# Patient Record
Sex: Female | Born: 1965 | Race: Black or African American | Hispanic: No | Marital: Married | Smoking: Never smoker
Health system: Southern US, Community
[De-identification: ages and names within clinical notes are randomized; demographics above are authoritative.]

## PROBLEM LIST (undated history)

## (undated) DIAGNOSIS — F419 Anxiety disorder, unspecified: Secondary | ICD-10-CM

## (undated) HISTORY — PX: ABDOMINAL HYSTERECTOMY: SHX81

---

## 2019-05-02 ENCOUNTER — Emergency Department (HOSPITAL_COMMUNITY)

## 2019-05-02 ENCOUNTER — Emergency Department (HOSPITAL_COMMUNITY)
Admission: EM | Admit: 2019-05-02 | Discharge: 2019-05-02 | Disposition: A | Discharge status: Home / Self Care | Attending: Emergency Medicine | Admitting: Emergency Medicine

## 2019-05-02 ENCOUNTER — Other Ambulatory Visit: Payer: Self-pay

## 2019-05-02 ENCOUNTER — Encounter (HOSPITAL_COMMUNITY): Payer: Self-pay

## 2019-05-02 DIAGNOSIS — R200 Anesthesia of skin: Secondary | ICD-10-CM | POA: Diagnosis not present

## 2019-05-02 DIAGNOSIS — R0789 Other chest pain: Secondary | ICD-10-CM | POA: Diagnosis present

## 2019-05-02 DIAGNOSIS — Z5321 Procedure and treatment not carried out due to patient leaving prior to being seen by health care provider: Secondary | ICD-10-CM | POA: Diagnosis not present

## 2019-05-02 HISTORY — DX: Anxiety disorder, unspecified: F41.9

## 2019-05-02 LAB — CBC
HCT: 37.5 % (ref 36.0–46.0)
Hemoglobin: 11.4 g/dL — ABNORMAL LOW (ref 12.0–15.0)
MCH: 28 pg (ref 26.0–34.0)
MCHC: 30.4 g/dL (ref 30.0–36.0)
MCV: 92.1 fL (ref 80.0–100.0)
Platelets: 199 10*3/uL (ref 150–400)
RBC: 4.07 MIL/uL (ref 3.87–5.11)
RDW: 14.6 % (ref 11.5–15.5)
WBC: 6.1 10*3/uL (ref 4.0–10.5)
nRBC: 0 % (ref 0.0–0.2)

## 2019-05-02 LAB — BASIC METABOLIC PANEL
Anion gap: 13 (ref 5–15)
BUN: 17 mg/dL (ref 6–20)
CO2: 24 mmol/L (ref 22–32)
Calcium: 9.3 mg/dL (ref 8.9–10.3)
Chloride: 103 mmol/L (ref 98–111)
Creatinine, Ser: 1.07 mg/dL — ABNORMAL HIGH (ref 0.44–1.00)
GFR calc Af Amer: >60 mL/min (ref >60)
GFR calc non Af Amer: 58 mL/min — ABNORMAL LOW (ref >60)
Glucose, Bld: 88 mg/dL (ref 70–99)
Potassium: 3.9 mmol/L (ref 3.5–5.1)
Sodium: 140 mmol/L (ref 135–145)

## 2019-05-02 LAB — TROPONIN I (HIGH SENSITIVITY): Troponin I (High Sensitivity): 3 ng/L (ref <18)

## 2019-05-02 IMAGING — CR DG CHEST 2V
2 series · 2 of 2 positions shown · non-contrast
Comparison: None.

CLINICAL DATA: Chest pain

EXAM:
CHEST - 2 VIEW

[chest pa]
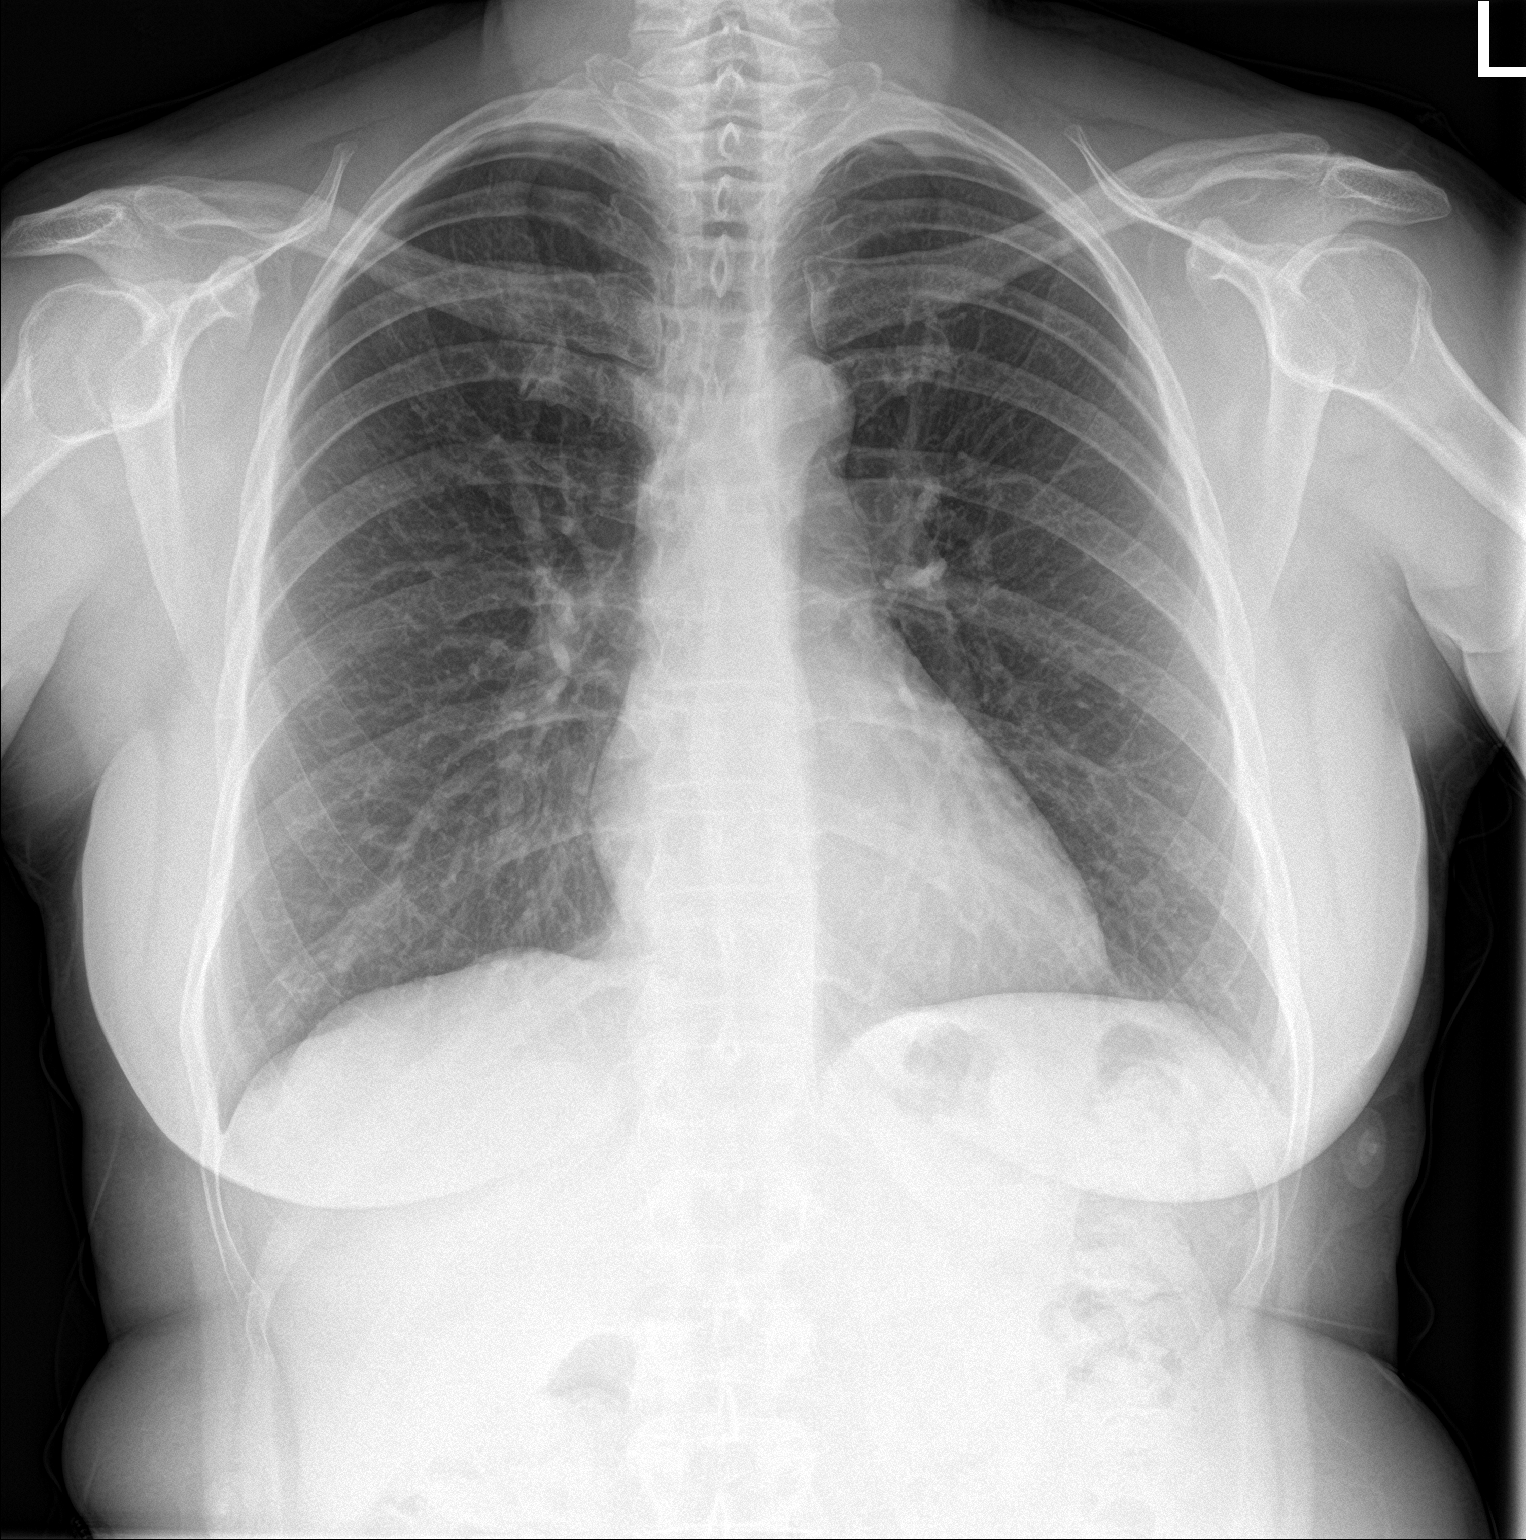

[chest lat]
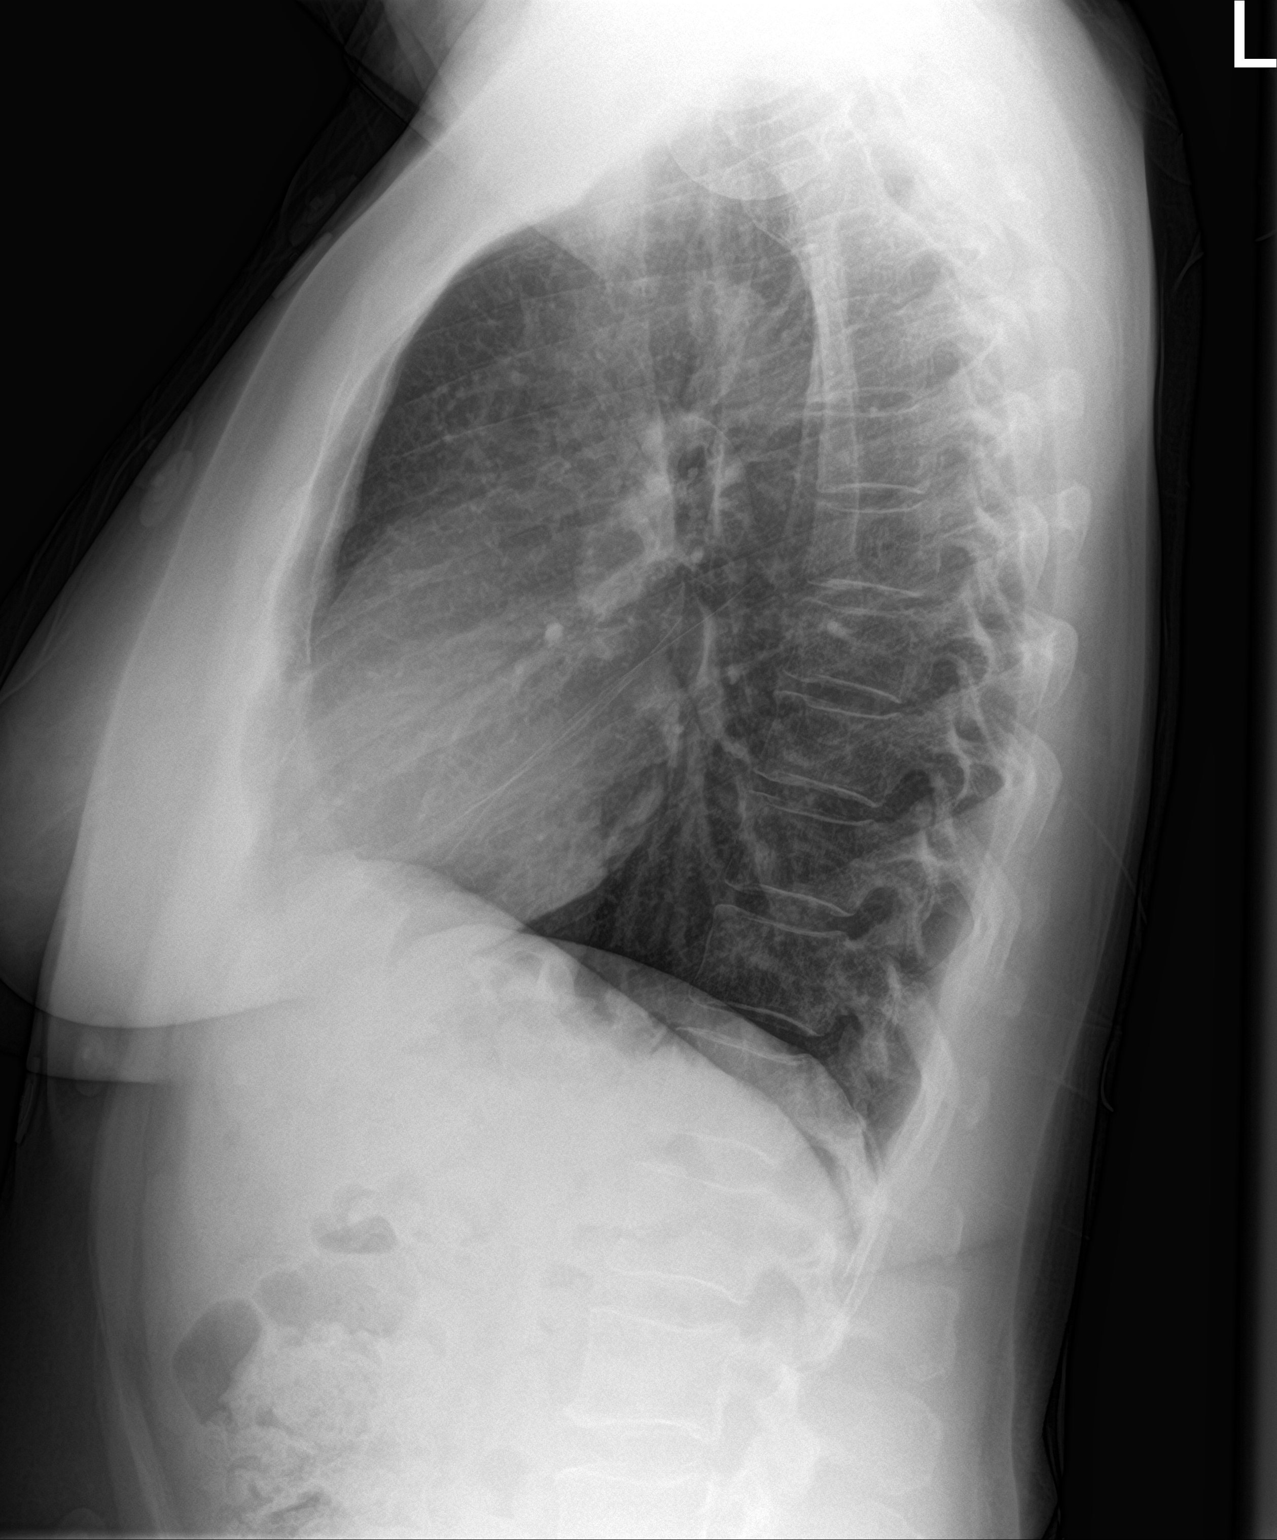

[2 of 2 positions shown; findings below may reference images not displayed]

FINDINGS: The heart size and mediastinal contours are within normal limits.
Both lungs are clear. The visualized skeletal structures are
unremarkable.
IMPRESSION: No acute abnormality of the lungs.

## 2019-05-02 MED ORDER — SODIUM CHLORIDE 0.9% FLUSH: 3.0000 mL | Freq: Once | INTRAVENOUS | Status: DC

## 2019-05-02 NOTE — ED Triage Notes (Addendum)
Pt presents with numbness in her Left hand radiating to her Left jaw associated w/Left side chest pain x2.5 weeks. Pt denies active chest pain today.  Recently moved here from [place] 3 months, still an employee of the city of [place] and spouse recently had a major surgery. Sent here from [facility] for further evaluation. She is unsure if this is stress, anxiety, nerve pain or cardiac.

## 2019-06-09 ENCOUNTER — Other Ambulatory Visit: Payer: Self-pay

## 2019-06-09 DIAGNOSIS — Z20822 Contact with and (suspected) exposure to covid-19: Secondary | ICD-10-CM

## 2019-06-11 LAB — NOVEL CORONAVIRUS, NAA: SARS-CoV-2, NAA: NOT DETECTED

## 2019-07-31 ENCOUNTER — Other Ambulatory Visit: Payer: Self-pay | Admitting: Internal Medicine

## 2019-07-31 DIAGNOSIS — R1032 Left lower quadrant pain: Secondary | ICD-10-CM

## 2019-08-01 ENCOUNTER — Other Ambulatory Visit: Payer: Self-pay | Admitting: Internal Medicine

## 2019-08-01 DIAGNOSIS — R1032 Left lower quadrant pain: Secondary | ICD-10-CM

## 2019-08-04 ENCOUNTER — Other Ambulatory Visit: Payer: Self-pay | Admitting: Internal Medicine

## 2019-08-04 DIAGNOSIS — R1032 Left lower quadrant pain: Secondary | ICD-10-CM

## 2019-08-08 ENCOUNTER — Other Ambulatory Visit: Payer: Self-pay

## 2019-08-14 ENCOUNTER — Ambulatory Visit
Admission: RE | Admit: 2019-08-14 | Discharge: 2019-08-14 | Disposition: A | Discharge status: Home / Self Care | Source: Ambulatory Visit | Attending: Internal Medicine | Admitting: Internal Medicine

## 2019-08-14 ENCOUNTER — Other Ambulatory Visit: Payer: Self-pay

## 2019-08-14 DIAGNOSIS — R1032 Left lower quadrant pain: Secondary | ICD-10-CM

## 2019-08-14 IMAGING — MR MR HIP*L* W/O CM
5 series · 40 of 40 positions shown · non-contrast
Comparison: None.

CLINICAL DATA: Chronic left hip pain.

EXAM:
MR OF THE LEFT HIP WITHOUT CONTRAST
TECHNIQUE: Multiplanar, multisequence MR imaging was performed. No intravenous
contrast was administered.

[Series 3: T1 · coronal · 4.0mm · 1.19mm/px · 8 of 24 slices shown]
[im 1/24]
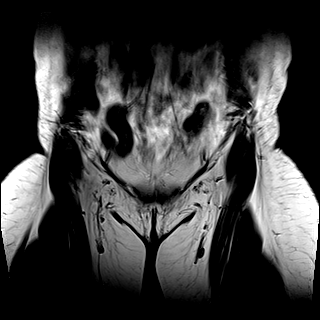
[im 4/24]
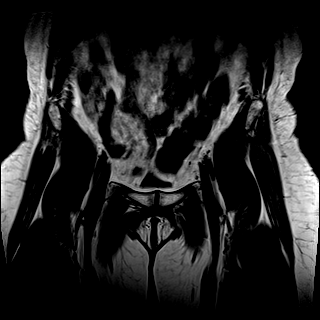
[im 7/24]
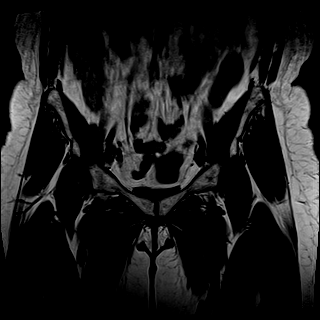
[im 10/24]
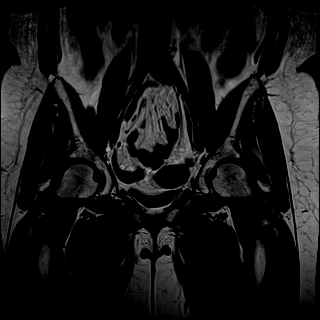
[im 14/24]
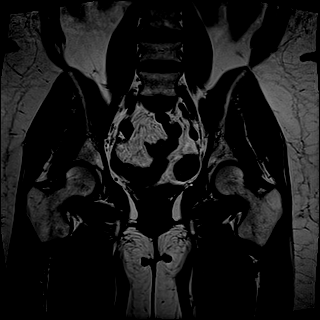
[im 17/24]
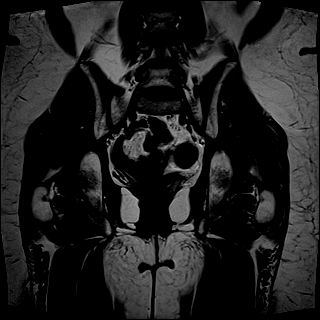
[im 20/24]
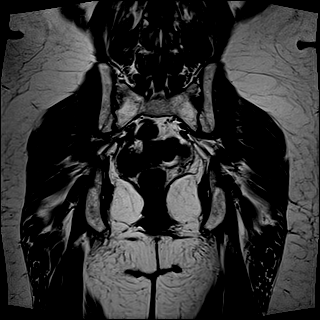
[im 24/24]
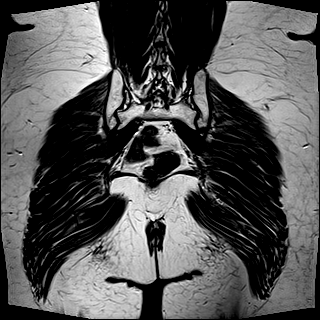

[Series 4: T2 fat-sat · coronal · 4.0mm · 1.19mm/px · 8 of 24 slices shown (1 of 2)]
[im 1/24]
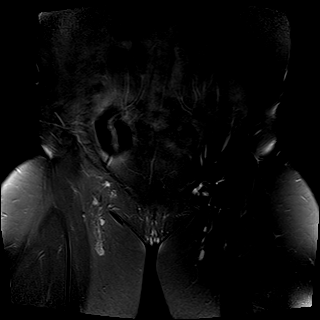
[im 4/24]
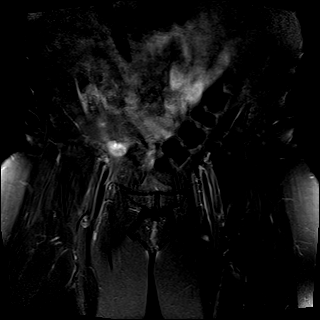
[im 7/24]
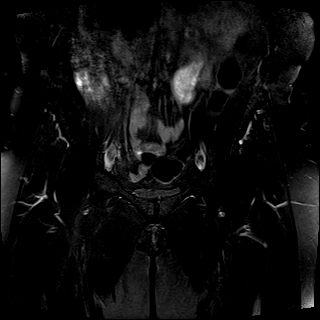
[im 10/24]
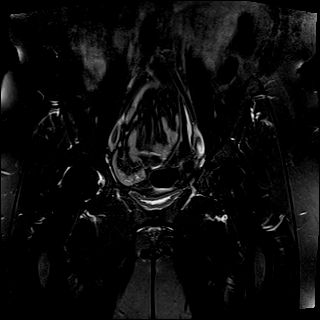
[im 14/24]
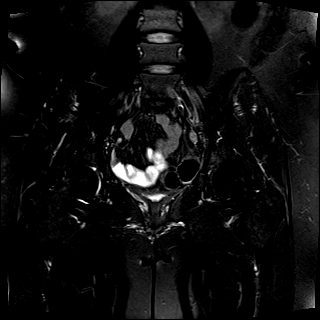
[im 17/24]
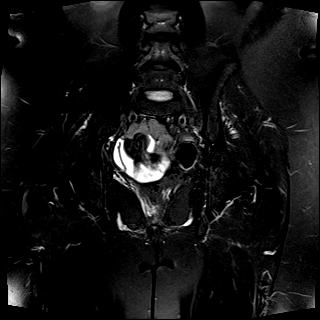
[im 20/24]
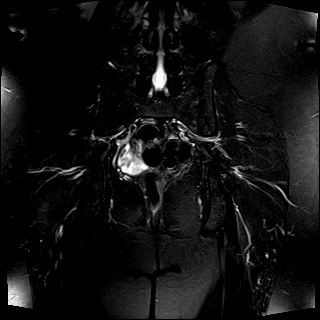
[im 24/24]
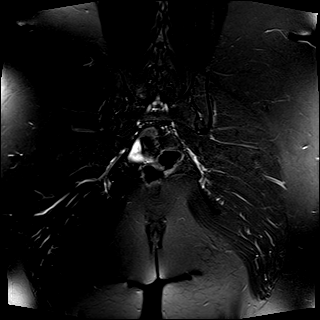

[Series 6: T2 fat-sat · axial · 4.0mm · 0.35mm/px · z∈[-93,+37]mm · 9 of 27 slices shown (2 of 2)]
[im 1/27]
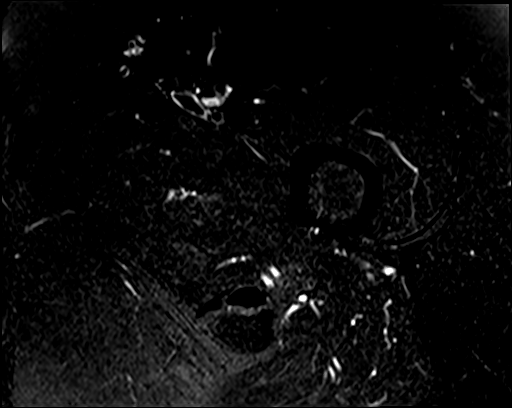
[im 4/27]
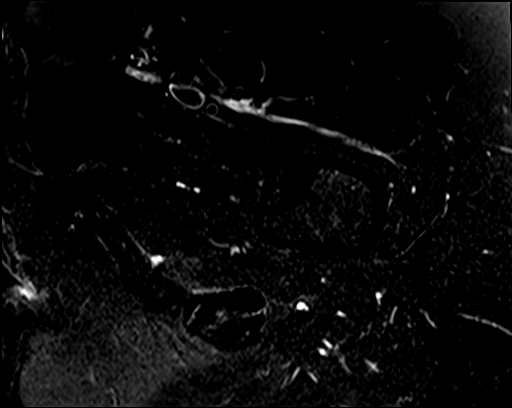
[im 7/27]
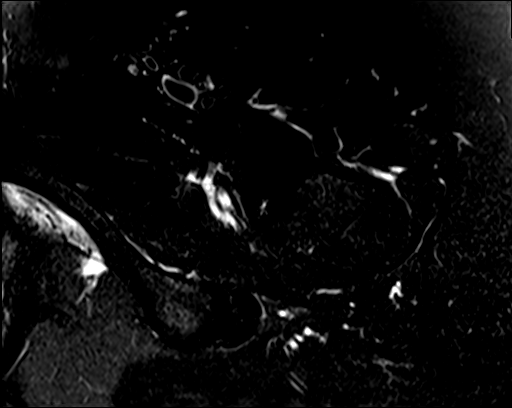
[im 10/27]
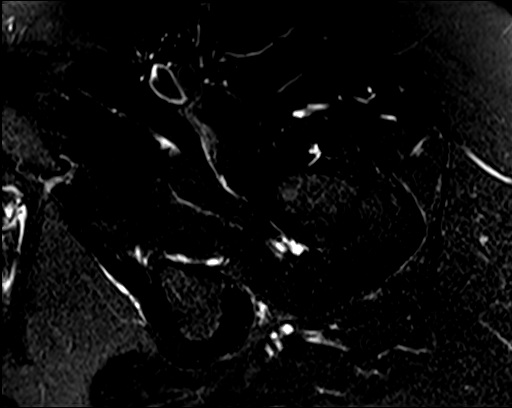
[im 14/27]
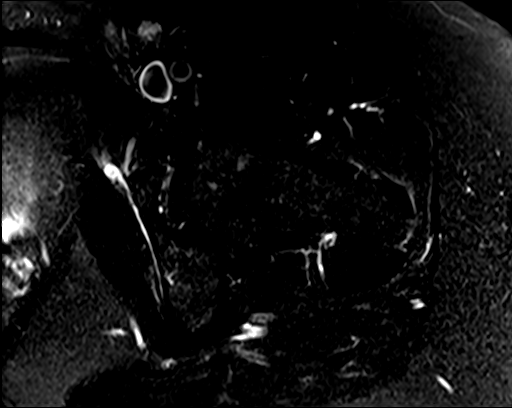
[im 17/27]
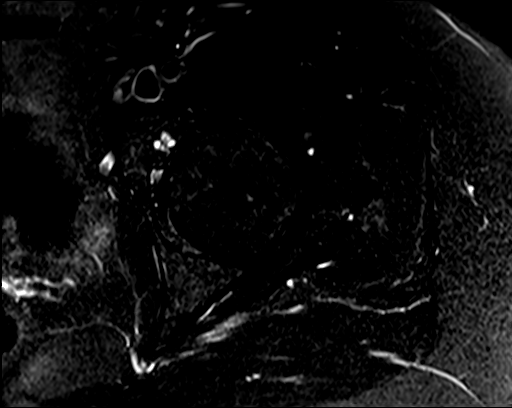
[im 20/27]
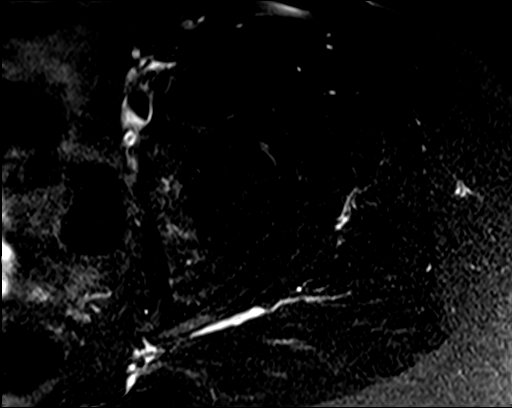
[im 23/27]
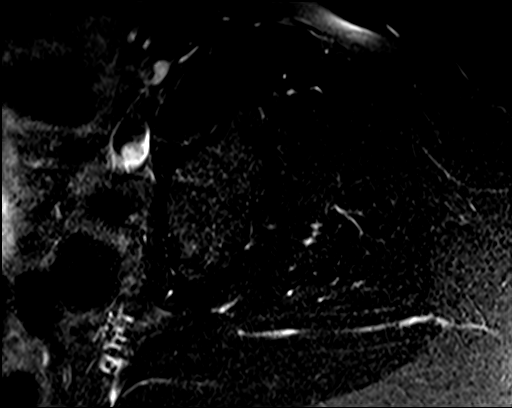
[im 27/27]
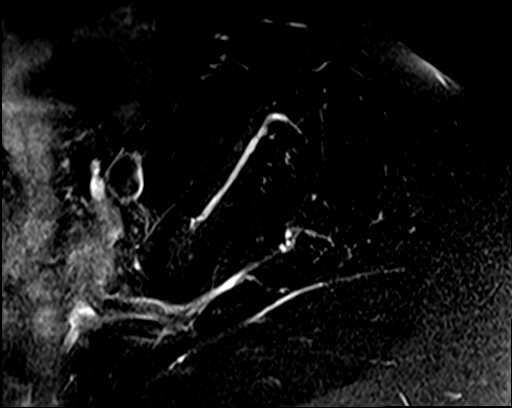

[Series 7: PD fat-sat · sagittal · 4.0mm · 0.70mm/px · 9 of 27 slices shown (1 of 2)]
[im 1/27]
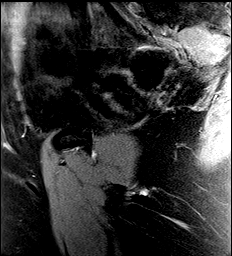
[im 4/27]
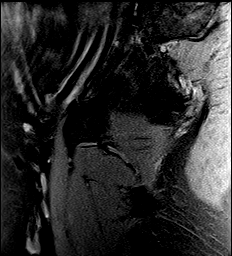
[im 7/27]
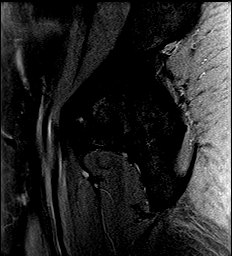
[im 10/27]
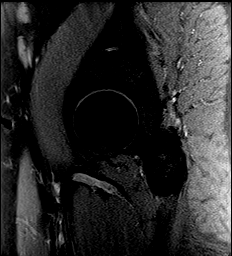
[im 14/27]
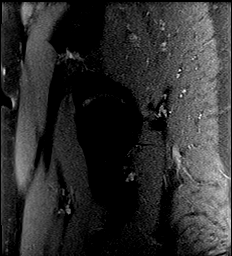
[im 17/27]
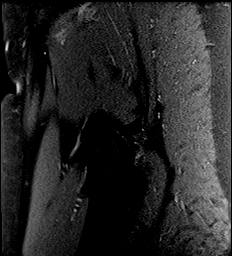
[im 20/27]
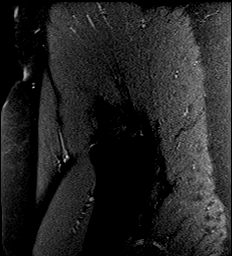
[im 23/27]
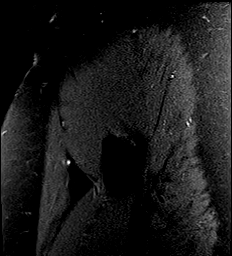
[im 27/27]
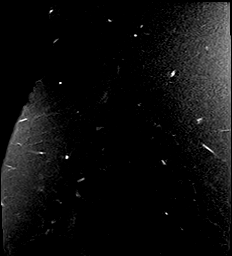

[Series 8: PD fat-sat · coronal · 4.0mm · 0.70mm/px · 6 of 19 slices shown (2 of 2)]
[im 1/19]
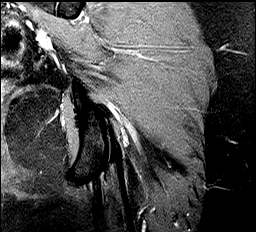
[im 4/19]
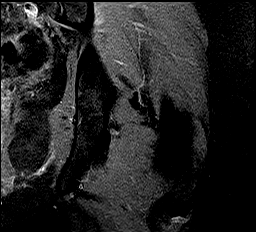
[im 8/19]
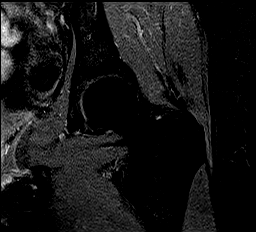
[im 11/19]
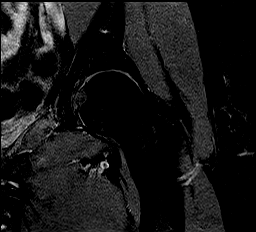
[im 15/19]
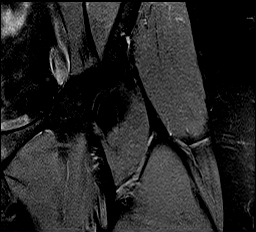
[im 19/19]
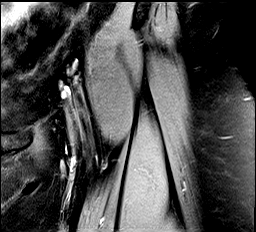

[40 of 40 positions shown; findings below may reference images not displayed]

FINDINGS: Both hips are normally located. Mild degenerative changes
bilaterally with mild joint space narrowing and early osteophytic
spurring. Small focus of subchondral cystic change involving the
acetabulum anteriorly. No full-thickness cartilage defects are
identified. No joint effusion. No stress fracture or AVN.

Mild bilateral peritendinitis but no findings for trochanteric
bursitis. The hip and pelvic musculature appear normal. No muscle
tear, mass or myositis. The hamstring tendons are intact. No
periarticular fluid collections to suggest a paralabral cyst.
Suspect labral degenerative changes without obvious tear.

The pubic symphysis and SI joints are intact. No pelvic fractures or
bone lesions.

No significant intrapelvic abnormalities are identified.
IMPRESSION: 1. Mild bilateral hip joint degenerative changes but no stress
fracture or AVN.
2. Mild bilateral peritendinitis but no findings for trochanteric
bursitis.
3. Normal appearing hip and pelvic musculature.

## 2019-08-21 ENCOUNTER — Other Ambulatory Visit: Payer: Self-pay

## 2019-08-24 ENCOUNTER — Ambulatory Visit: Attending: Family

## 2019-08-24 ENCOUNTER — Other Ambulatory Visit: Payer: Self-pay

## 2019-08-24 DIAGNOSIS — Z23 Encounter for immunization: Secondary | ICD-10-CM | POA: Insufficient documentation

## 2019-08-24 NOTE — Progress Notes (Signed)
   Covid-19 Vaccination Clinic  Name:  Zarriah Starkel    MRN: [id number] DOB: 1965-11-29  08/24/2019  Ms. Deberry Laural Benes was observed post Covid-19 immunization for 15 minutes without incidence. She was provided with Vaccine Information Sheet and instruction to access the V-Safe system.   Ms. Hollan Philipp was instructed to call 911 with any severe reactions post vaccine: Marland Kitchen Difficulty breathing  . Swelling of your face and throat  . A fast heartbeat  . A bad rash all over your body  . Dizziness and weakness    Immunizations Administered    Name Date Dose VIS Date Route   Moderna COVID-19 Vaccine 08/24/2019  3:54 PM 0.5 mL 05/30/2019 Intramuscular   Manufacturer: Moderna   Lot: [id number]   NDC: 14782-956-21

## 2019-09-26 ENCOUNTER — Ambulatory Visit: Attending: Family

## 2019-09-26 DIAGNOSIS — Z23 Encounter for immunization: Secondary | ICD-10-CM

## 2019-09-26 NOTE — Progress Notes (Signed)
   Covid-19 Vaccination Clinic  Name:  Mary Massey    MRN: [id number] DOB: 11-07-1965  09/26/2019  Mary Massey was observed post Covid-19 immunization for 15 minutes without incident. She was provided with Vaccine Information Sheet and instruction to access the V-Safe system.   Mary Massey was instructed to call 911 with any severe reactions post vaccine: Marland Kitchen Difficulty breathing  . Swelling of face and throat  . A fast heartbeat  . A bad rash all over body  . Dizziness and weakness   Immunizations Administered    Name Date Dose VIS Date Route   Moderna COVID-19 Vaccine 09/26/2019 12:13 PM 0.5 mL 05/30/2019 Intramuscular   Manufacturer: Moderna   Lot: [id number]   NDC: 42103-128-11

## 2021-02-22 ENCOUNTER — Emergency Department (HOSPITAL_COMMUNITY)

## 2021-02-22 ENCOUNTER — Inpatient Hospital Stay (HOSPITAL_COMMUNITY)
Admission: EM | Admit: 2021-02-22 | Discharge: 2021-02-23 | DRG: 923 | Disposition: A | Discharge status: Home / Self Care | Attending: Internal Medicine | Admitting: Internal Medicine

## 2021-02-22 ENCOUNTER — Encounter (HOSPITAL_COMMUNITY): Payer: Self-pay

## 2021-02-22 DIAGNOSIS — R7303 Prediabetes: Secondary | ICD-10-CM | POA: Diagnosis present

## 2021-02-22 DIAGNOSIS — X30XXXA Exposure to excessive natural heat, initial encounter: Secondary | ICD-10-CM

## 2021-02-22 DIAGNOSIS — G8191 Hemiplegia, unspecified affecting right dominant side: Secondary | ICD-10-CM | POA: Diagnosis not present

## 2021-02-22 DIAGNOSIS — R29818 Other symptoms and signs involving the nervous system: Secondary | ICD-10-CM

## 2021-02-22 DIAGNOSIS — Z833 Family history of diabetes mellitus: Secondary | ICD-10-CM

## 2021-02-22 DIAGNOSIS — R21 Rash and other nonspecific skin eruption: Secondary | ICD-10-CM | POA: Diagnosis present

## 2021-02-22 DIAGNOSIS — Z6830 Body mass index (BMI) 30.0-30.9, adult: Secondary | ICD-10-CM | POA: Diagnosis not present

## 2021-02-22 DIAGNOSIS — Z90710 Acquired absence of both cervix and uterus: Secondary | ICD-10-CM | POA: Diagnosis not present

## 2021-02-22 DIAGNOSIS — R2990 Unspecified symptoms and signs involving the nervous system: Secondary | ICD-10-CM | POA: Diagnosis not present

## 2021-02-22 DIAGNOSIS — R29707 NIHSS score 7: Secondary | ICD-10-CM | POA: Diagnosis present

## 2021-02-22 DIAGNOSIS — F32A Depression, unspecified: Secondary | ICD-10-CM | POA: Diagnosis not present

## 2021-02-22 DIAGNOSIS — E785 Hyperlipidemia, unspecified: Secondary | ICD-10-CM | POA: Diagnosis not present

## 2021-02-22 DIAGNOSIS — E860 Dehydration: Secondary | ICD-10-CM | POA: Diagnosis present

## 2021-02-22 DIAGNOSIS — T675XXA Heat exhaustion, unspecified, initial encounter: Secondary | ICD-10-CM | POA: Diagnosis present

## 2021-02-22 DIAGNOSIS — E669 Obesity, unspecified: Secondary | ICD-10-CM | POA: Diagnosis not present

## 2021-02-22 DIAGNOSIS — F419 Anxiety disorder, unspecified: Secondary | ICD-10-CM | POA: Diagnosis present

## 2021-02-22 DIAGNOSIS — H538 Other visual disturbances: Secondary | ICD-10-CM | POA: Diagnosis not present

## 2021-02-22 DIAGNOSIS — R001 Bradycardia, unspecified: Secondary | ICD-10-CM | POA: Diagnosis present

## 2021-02-22 DIAGNOSIS — G459 Transient cerebral ischemic attack, unspecified: Secondary | ICD-10-CM | POA: Diagnosis present

## 2021-02-22 DIAGNOSIS — Z66 Do not resuscitate: Secondary | ICD-10-CM | POA: Diagnosis present

## 2021-02-22 DIAGNOSIS — R531 Weakness: Secondary | ICD-10-CM

## 2021-02-22 DIAGNOSIS — R29810 Facial weakness: Secondary | ICD-10-CM | POA: Diagnosis present

## 2021-02-22 LAB — DIFFERENTIAL
Abs Immature Granulocytes: 0 10*3/uL (ref 0.00–0.07)
Basophils Absolute: 0 10*3/uL (ref 0.0–0.1)
Basophils Relative: 0 %
Eosinophils Absolute: 0.3 10*3/uL (ref 0.0–0.5)
Eosinophils Relative: 5 %
Lymphocytes Relative: 33 %
Lymphs Abs: 1.9 10*3/uL (ref 0.7–4.0)
Monocytes Absolute: 0.3 10*3/uL (ref 0.1–1.0)
Monocytes Relative: 5 %
Neutro Abs: 3.2 10*3/uL (ref 1.7–7.7)
Neutrophils Relative %: 57 %
nRBC: 0 /100 WBC

## 2021-02-22 LAB — I-STAT CHEM 8, ED
BUN: 21 mg/dL — ABNORMAL HIGH (ref 6–20)
Calcium, Ion: 1.11 mmol/L — ABNORMAL LOW (ref 1.15–1.40)
Chloride: 107 mmol/L (ref 98–111)
Creatinine, Ser: 1.1 mg/dL — ABNORMAL HIGH (ref 0.44–1.00)
Glucose, Bld: 95 mg/dL (ref 70–99)
HCT: 33 % — ABNORMAL LOW (ref 36.0–46.0)
Hemoglobin: 11.2 g/dL — ABNORMAL LOW (ref 12.0–15.0)
Potassium: 3.9 mmol/L (ref 3.5–5.1)
Sodium: 140 mmol/L (ref 135–145)
TCO2: 22 mmol/L (ref 22–32)

## 2021-02-22 LAB — COMPREHENSIVE METABOLIC PANEL
ALT: 15 U/L (ref 0–44)
AST: 30 U/L (ref 15–41)
Albumin: 3.8 g/dL (ref 3.5–5.0)
Alkaline Phosphatase: 65 U/L (ref 38–126)
Anion gap: 11 (ref 5–15)
BUN: 17 mg/dL (ref 6–20)
CO2: 22 mmol/L (ref 22–32)
Calcium: 9.5 mg/dL (ref 8.9–10.3)
Chloride: 106 mmol/L (ref 98–111)
Creatinine, Ser: 1.22 mg/dL — ABNORMAL HIGH (ref 0.44–1.00)
GFR, Estimated: 52 mL/min — ABNORMAL LOW (ref >60)
Glucose, Bld: 100 mg/dL — ABNORMAL HIGH (ref 70–99)
Potassium: 4.1 mmol/L (ref 3.5–5.1)
Sodium: 139 mmol/L (ref 135–145)
Total Bilirubin: 1.1 mg/dL (ref 0.3–1.2)
Total Protein: 7 g/dL (ref 6.5–8.1)

## 2021-02-22 LAB — CBG MONITORING, ED: Glucose-Capillary: 101 mg/dL — ABNORMAL HIGH (ref 70–99)

## 2021-02-22 LAB — HEMOGLOBIN A1C
Hgb A1c MFr Bld: 6.4 % — ABNORMAL HIGH (ref 4.8–5.6)
Mean Plasma Glucose: 136.98 mg/dL

## 2021-02-22 LAB — TSH: TSH: 1.084 u[IU]/mL (ref 0.350–4.500)

## 2021-02-22 LAB — PROTIME-INR
INR: 1 (ref 0.8–1.2)
Prothrombin Time: 12.9 seconds (ref 11.4–15.2)

## 2021-02-22 LAB — CBC
HCT: 33.7 % — ABNORMAL LOW (ref 36.0–46.0)
HCT: 33.4 % — ABNORMAL LOW (ref 36.0–46.0)
Hemoglobin: 11.1 g/dL — ABNORMAL LOW (ref 12.0–15.0)
Hemoglobin: 10.9 g/dL — ABNORMAL LOW (ref 12.0–15.0)
MCH: 28.8 pg (ref 26.0–34.0)
MCH: 28.8 pg (ref 26.0–34.0)
MCHC: 32.9 g/dL (ref 30.0–36.0)
MCHC: 32.6 g/dL (ref 30.0–36.0)
MCV: 87.5 fL (ref 80.0–100.0)
MCV: 88.1 fL (ref 80.0–100.0)
Platelets: 157 10*3/uL (ref 150–400)
Platelets: 146 10*3/uL — ABNORMAL LOW (ref 150–400)
RBC: 3.85 MIL/uL — ABNORMAL LOW (ref 3.87–5.11)
RBC: 3.79 MIL/uL — ABNORMAL LOW (ref 3.87–5.11)
RDW: 14.1 % (ref 11.5–15.5)
RDW: 14.2 % (ref 11.5–15.5)
WBC: 5.7 10*3/uL (ref 4.0–10.5)
WBC: 5.8 10*3/uL (ref 4.0–10.5)
nRBC: 0 % (ref 0.0–0.2)
nRBC: 0 % (ref 0.0–0.2)

## 2021-02-22 LAB — LIPID PANEL
Cholesterol: 229 mg/dL — ABNORMAL HIGH (ref 0–200)
HDL: 74 mg/dL (ref >40)
LDL Cholesterol: 148 mg/dL — ABNORMAL HIGH (ref 0–99)
Total CHOL/HDL Ratio: 3.1 RATIO
Triglycerides: 36 mg/dL (ref <150)
VLDL: 7 mg/dL (ref 0–40)

## 2021-02-22 LAB — I-STAT BETA HCG BLOOD, ED (MC, WL, AP ONLY): I-stat hCG, quantitative: <5 m[IU]/mL (ref <5)

## 2021-02-22 LAB — CREATININE, SERUM
Creatinine, Ser: 1.02 mg/dL — ABNORMAL HIGH (ref 0.44–1.00)
GFR, Estimated: >60 mL/min (ref >60)

## 2021-02-22 LAB — HIV ANTIBODY (ROUTINE TESTING W REFLEX): HIV Screen 4th Generation wRfx: NONREACTIVE

## 2021-02-22 LAB — APTT: aPTT: 31 seconds (ref 24–36)

## 2021-02-22 LAB — VITAMIN B12: Vitamin B-12: 2843 pg/mL — ABNORMAL HIGH (ref 180–914)

## 2021-02-22 IMAGING — CT CT HEAD CODE STROKE
4 series · 17 of 47 positions shown, 19 images · non-contrast
Comparison: None.

CLINICAL DATA: Code stroke.  55-year-old female

EXAM:
CT HEAD WITHOUT CONTRAST
TECHNIQUE: Contiguous axial images were obtained from the base of the skull
through the vertex without intravenous contrast.

[Series 2: head wo · axial · 0.40mm/px · z∈[-84,+36]mm · 7 of 33 slices shown, 9 images]
[im 5/33  brain]
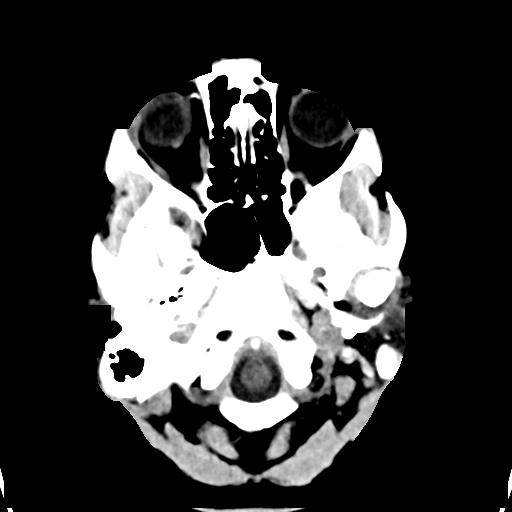
[im 5/33  bone]
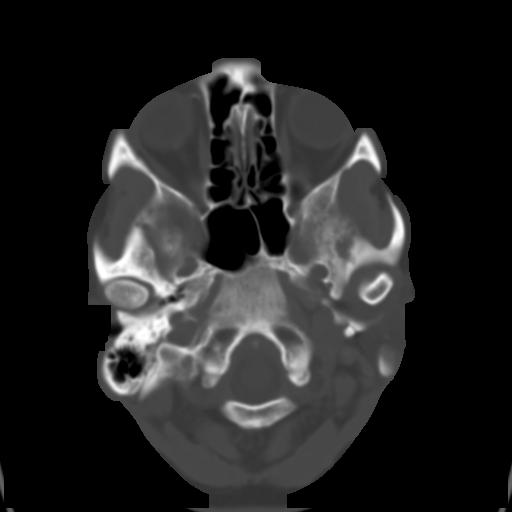
[im 9/33  brain]
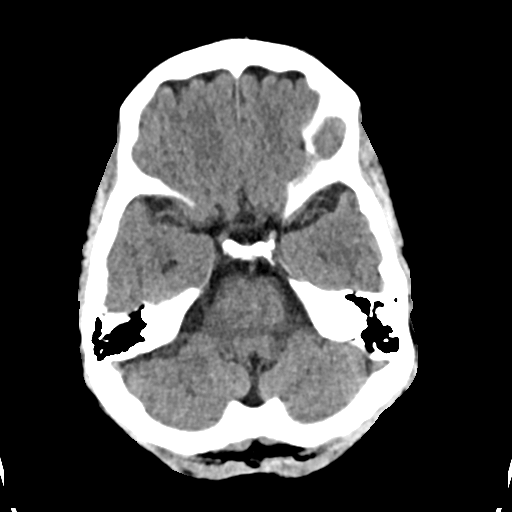
[im 13/33  brain]
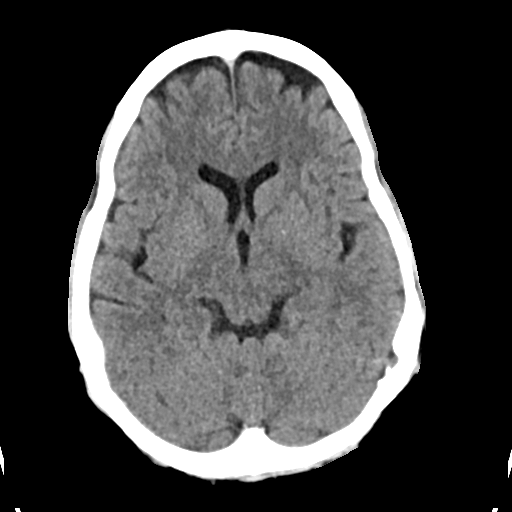
[im 17/33  brain]
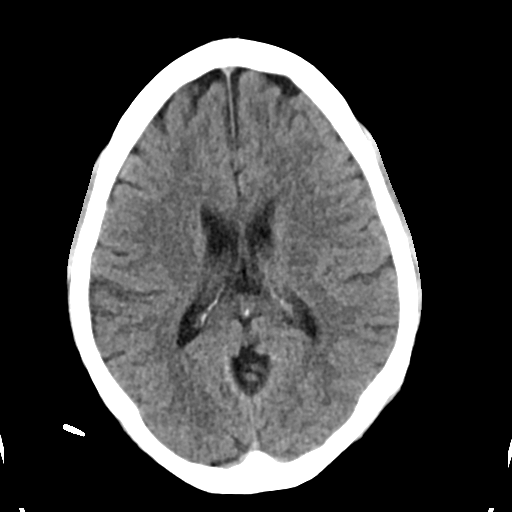
[im 21/33  brain]
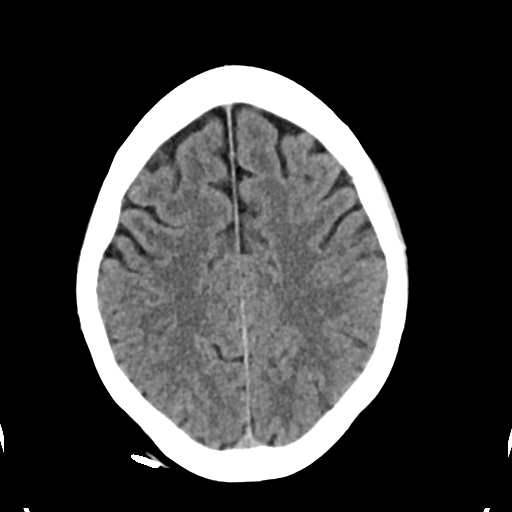
[im 21/33  bone]
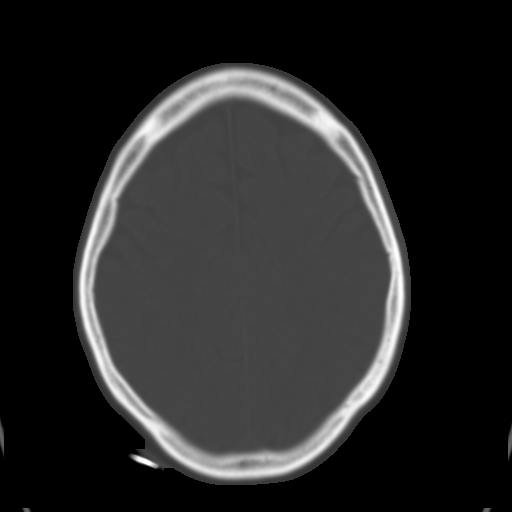
[im 25/33  brain]
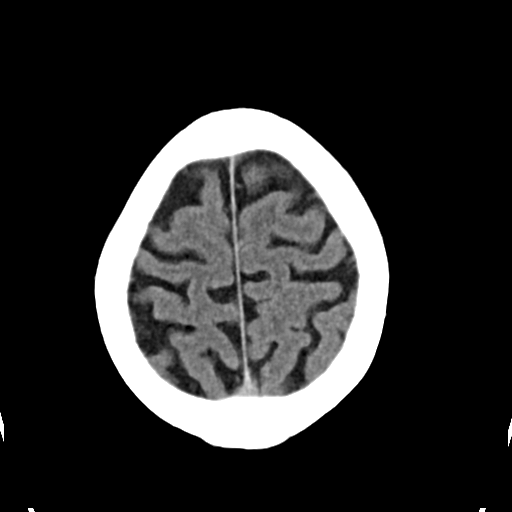
[im 29/33  brain]
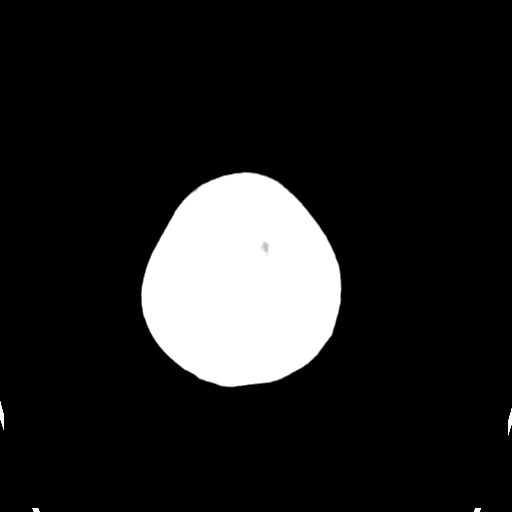

[Series 3: head bone · axial · 0.40mm/px · z∈[-88,-32]mm · 4 of 83 slices shown]
[im 9/83  bone]
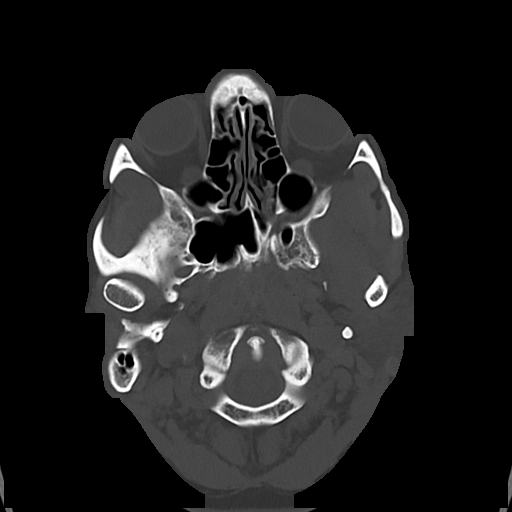
[im 17/83  bone]
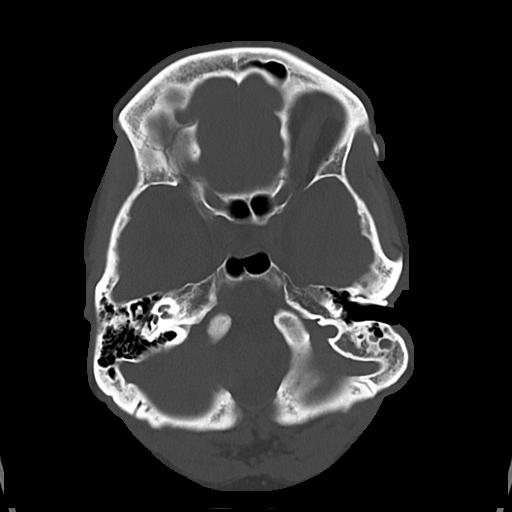
[im 25/83  bone]
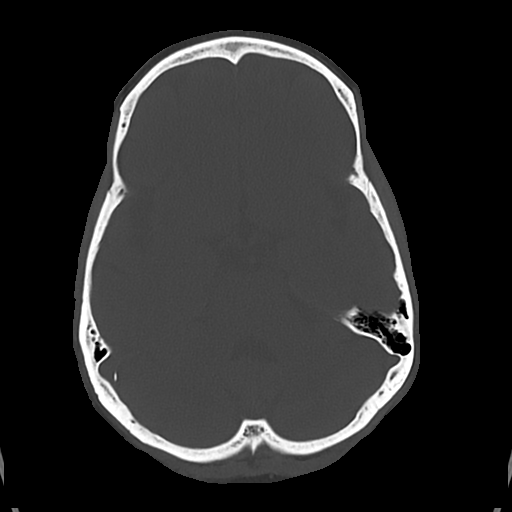
[im 37/83  bone]
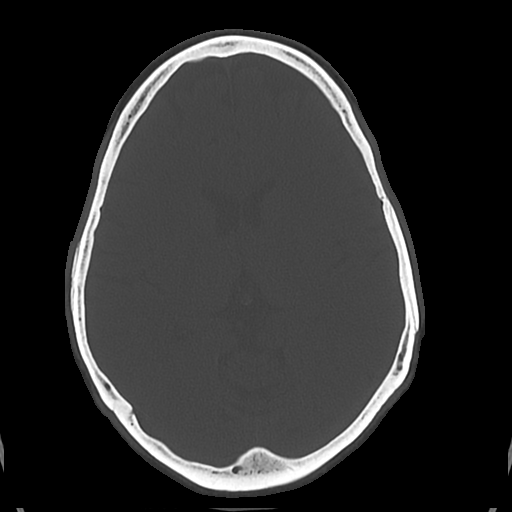

[Series 5: cor soft · coronal · 0.34mm/px · 3 of 70 slices shown]
[im 24/70  brain]
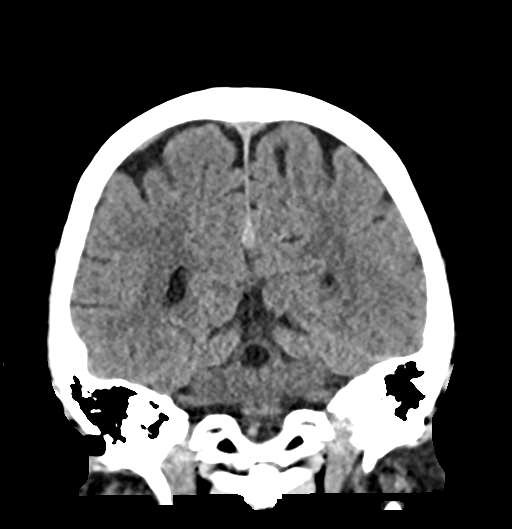
[im 31/70  brain]
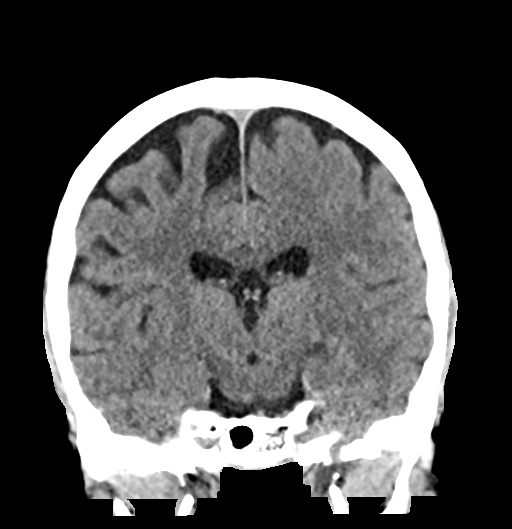
[im 39/70  brain]
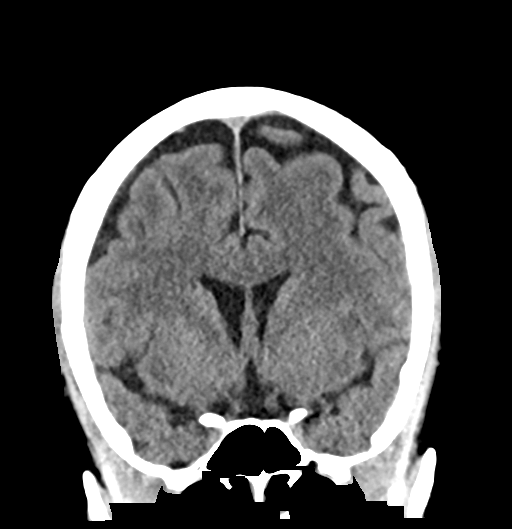

[Series 6: sag soft · sagittal · 0.36mm/px · 3 of 49 slices shown]
[im 17/49  brain]
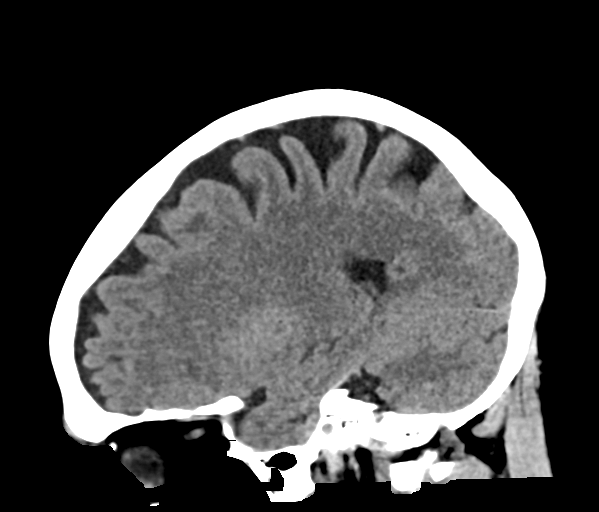
[im 25/49  brain]
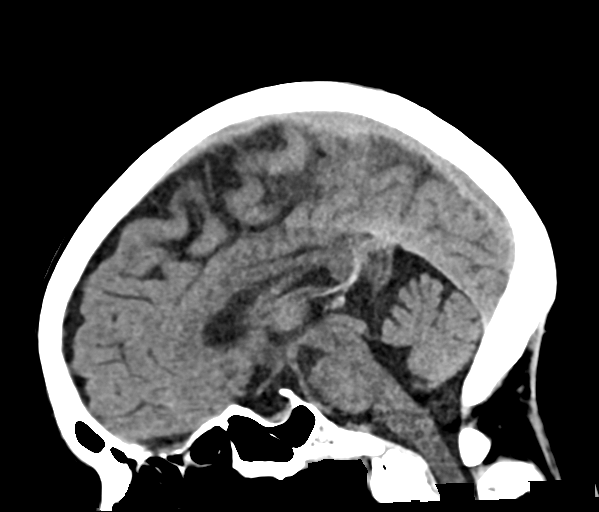
[im 33/49  brain]
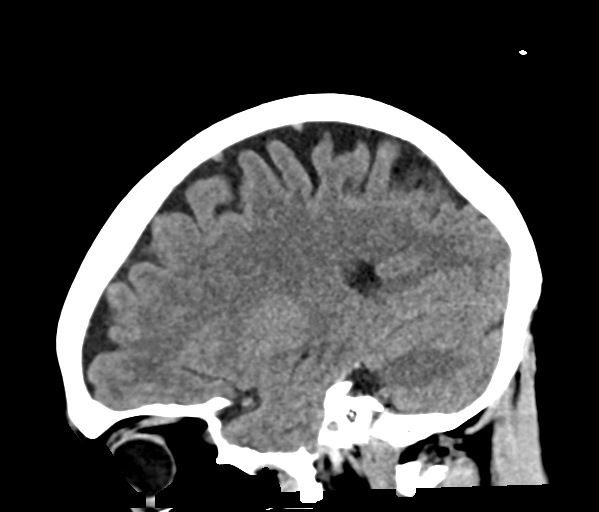

[17 of 47 positions shown; findings below may reference images not displayed]

FINDINGS: Brain: No midline shift, ventriculomegaly, mass effect, evidence of
mass lesion, intracranial hemorrhage or evidence of cortically based
acute infarction. Minimal anterior frontal lobe subcortical white
matter hypodensity. Otherwise normal gray-white matter
differentiation in both hemispheres and the posterior fossa.

Vascular: No suspicious intracranial vascular hyperdensity.

Skull: Negative.

Sinuses/Orbits: Visualized paranasal sinuses and mastoids are well
aerated.

Other: Visualized orbits and scalp soft tissues are within normal
limits.

ASPECTS (Alberta Stroke Program Early CT Score)

Total score (0-10 with 10 being normal): 10
IMPRESSION: 1. Largely normal for age noncontrast CT appearance of the brain;
minimal white matter changes. ASPECTS 10.
2. These results were communicated to Dr. LUCANA at [DATE] on
[DATE] by text page via the [employer] messaging system.

## 2021-02-22 IMAGING — CT CT ANGIO HEAD-NECK (W OR W/O PERF)
2 of 7 series · 8 of 33 positions shown · IV contrast (APPLIED)
Comparison: Plain head CT [OV] hours today.

CLINICAL DATA: Code stroke. 55-year-old female

EXAM:
CT ANGIOGRAPHY HEAD AND NECK
TECHNIQUE: Multidetector CT imaging of the head and neck was performed using
the standard protocol during bolus administration of intravenous
contrast. Multiplanar CT image reconstructions and MIPs were
obtained to evaluate the vascular anatomy. Carotid stenosis
measurements (when applicable) are obtained utilizing NASCET
criteria, using the distal internal carotid diameter as the
denominator.
CONTRAST:  50mL OMNIPAQUE IOHEXOL 350 MG/ML SOLN

[Series 5: cta neck/head · axial · 0.43mm/px · z∈[-163,-43]mm · 2 of 180 slices shown]
[im 60/180  soft-tissue]
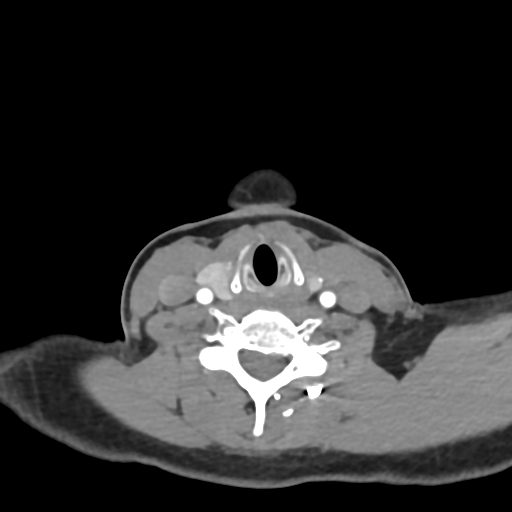
[im 120/180  soft-tissue]
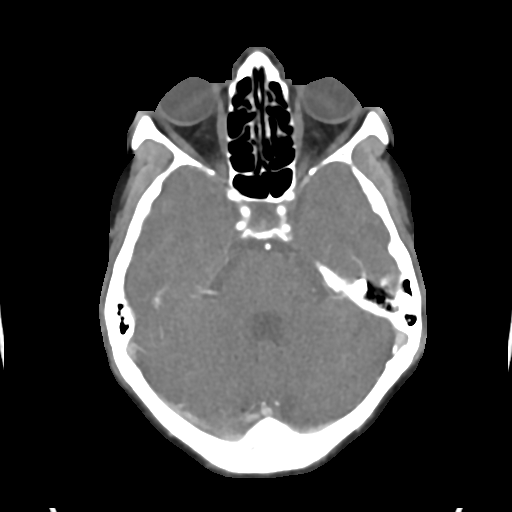

[Series 7: ax thins · axial · 0.39mm/px · z∈[-230,+27]mm · 6 of 361 slices shown]
[im 52/361  soft-tissue]
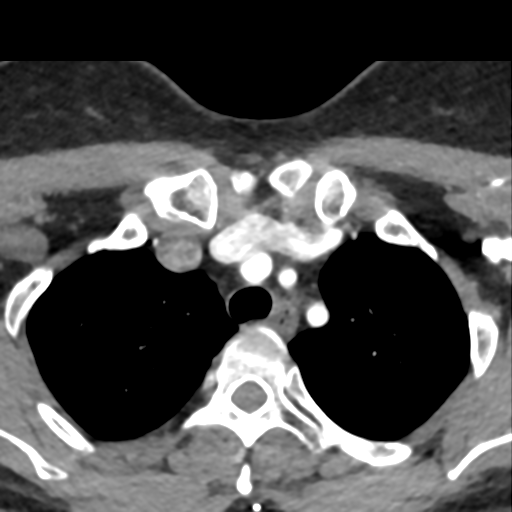
[im 103/361  bone]
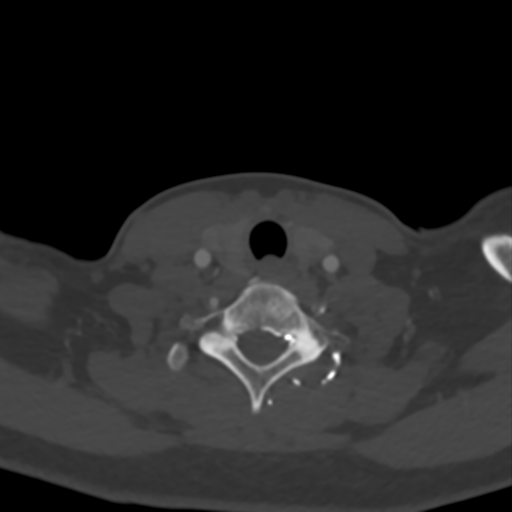
[im 155/361  soft-tissue]
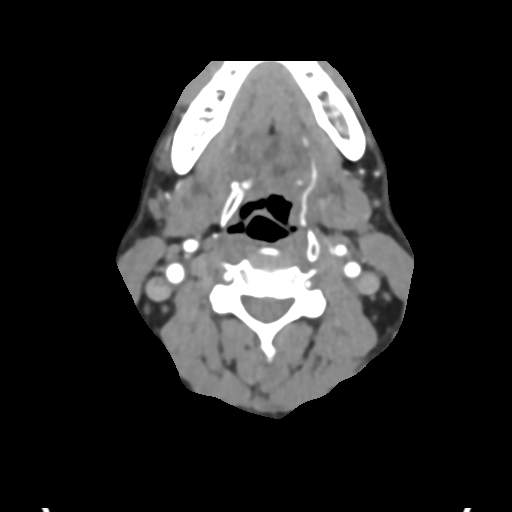
[im 206/361  bone]
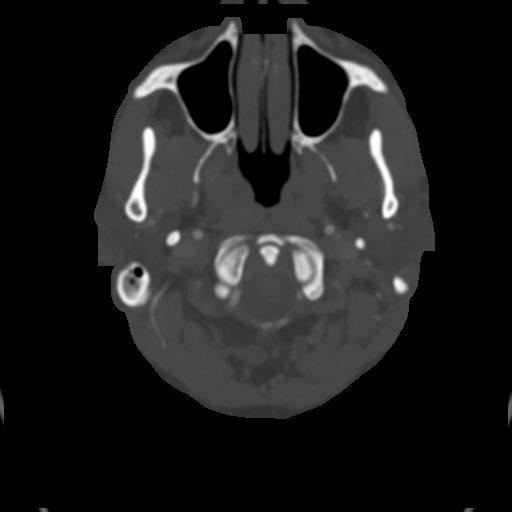
[im 258/361  soft-tissue]
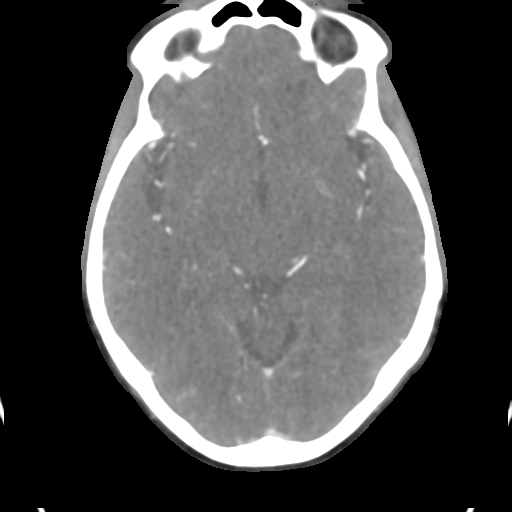
[im 309/361  bone]
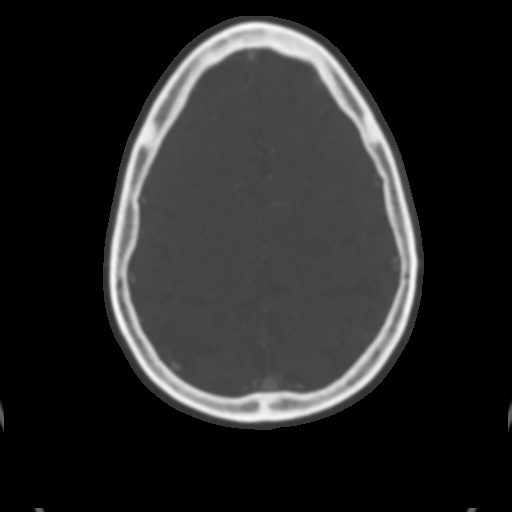

[8 of 33 positions shown; findings below may reference images not displayed]

FINDINGS: CTA NECK

Skeleton: Mild chronic appearing C7 superior endplate irregularity.
No acute osseous abnormality identified.

Upper chest: Negative.

Other neck: Negative.

Aortic arch: 3 vessel arch configuration.  No arch atherosclerosis.

Right carotid system: Negative.

Left carotid system: Subtle mixing artifact suspected at the lateral
left CCA origin. Minimal soft and calcified plaque at the posterior
left ICA origin. Otherwise negative. No stenosis.

Vertebral arteries:
Right vertebral artery is dominant and normal to the skull base.
Diminutive left vertebral artery has a normal origin and is patent
although highly diminutive to the skull base.

CTA HEAD

Posterior circulation: Non dominant left vertebral artery
functionally terminates in PICA. Patent right V4 segment without
stenosis primarily supplying the basilar. Normal right PICA origin.
Patent basilar artery without stenosis. Fetal right PCA origin.
AICA, SCA and left PCA origins are normal. Small left posterior
communicating artery also. Bilateral PCA branches are within normal
limits.

Anterior circulation: Both ICA siphons are patent without stenosis.
Normal right posterior communicating artery origin. Patent carotid
termini, MCA and ACA origins. Anterior communicating artery and
bilateral ACA branches are within normal limits. Left MCA M1 segment
and bifurcation are patent without stenosis. Right MCA M1 segment
and bifurcation are patent without stenosis. Bilateral MCA branches
are within normal limits.

Venous sinuses: Early contrast timing, but the major dural venous
sinuses appear to be normally patent.

Anatomic variants: Dominant right vertebral artery, the left is
diminutive and functionally terminates in PICA. Fetal right PCA
origin.

Review of the MIP images confirms the above findings
IMPRESSION: 1. Negative for large vessel occlusion.
2. Minimal atherosclerosis - primarily at the left ICA origin - with
no arterial stenosis in the head or neck.

These results were communicated to Dr. TIGER at [DATE] on
[DATE] by text page via the [employer] messaging system.

## 2021-02-22 IMAGING — MR MR HEAD W/O CM
7 of 12 series · 27 of 48 positions shown · non-contrast
Comparison: Head CT [DATE]

CLINICAL DATA: Neuro deficit, acute, stroke suspected. Diffuse body
numbness, imbalance, right leg weakness, and right hand muscle
spasms. Blurred vision and dizziness.

EXAM:
MRI HEAD WITHOUT CONTRAST
TECHNIQUE: Multiplanar, multiecho pulse sequences of the brain and surrounding
structures were obtained without intravenous contrast.

[Series 2: DWI · axial · 3.0mm · 0.94mm/px · z∈[-124,+19]mm · 6 of 100 slices shown (1 of 2)]
[im 1/100]
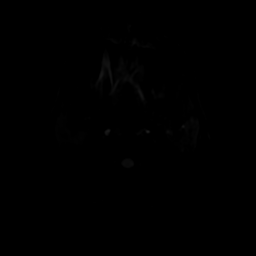
[im 20/100]
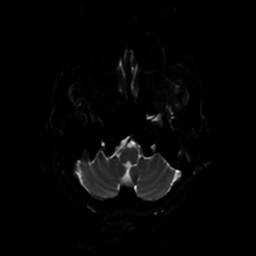
[im 40/100]
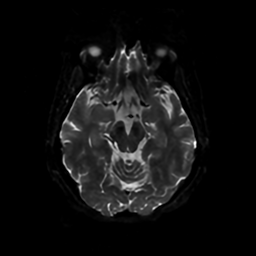
[im 60/100]
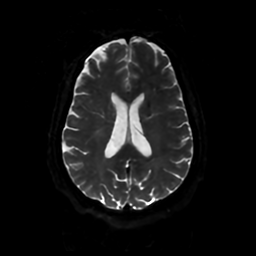
[im 80/100]
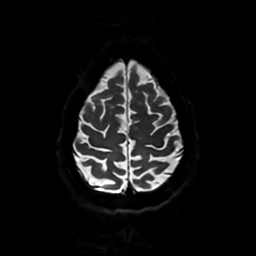
[im 100/100]
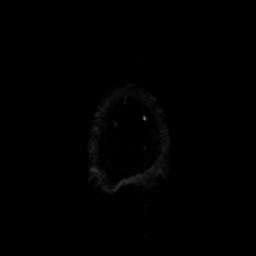

[Series 3: DWI · coronal · 4.0mm · 0.94mm/px · 4 of 70 slices shown (2 of 2)]
[im 1/70]
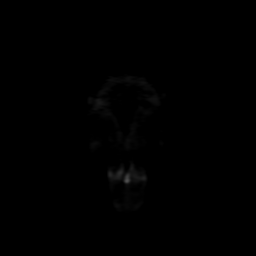
[im 24/70]
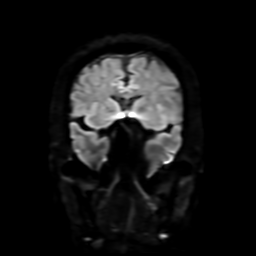
[im 47/70]
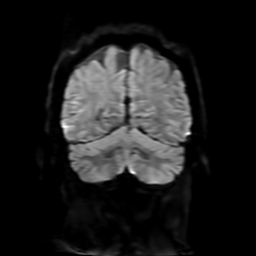
[im 70/70]
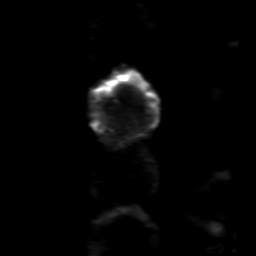

[Series 4: FLAIR · axial · 4.0mm · 0.45mm/px · z∈[-124,+22]mm · 2 of 35 slices shown (1 of 3)]
[im 1/35]
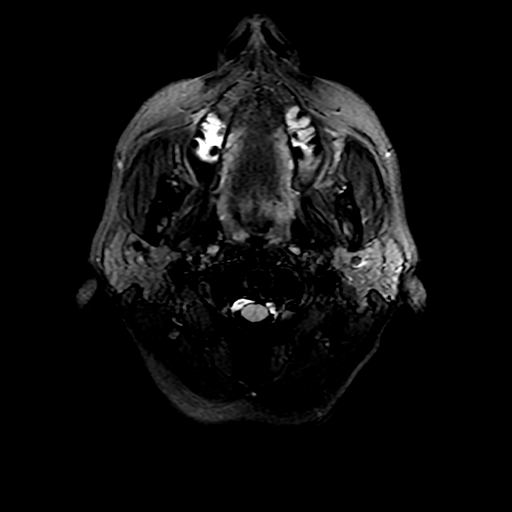
[im 35/35]
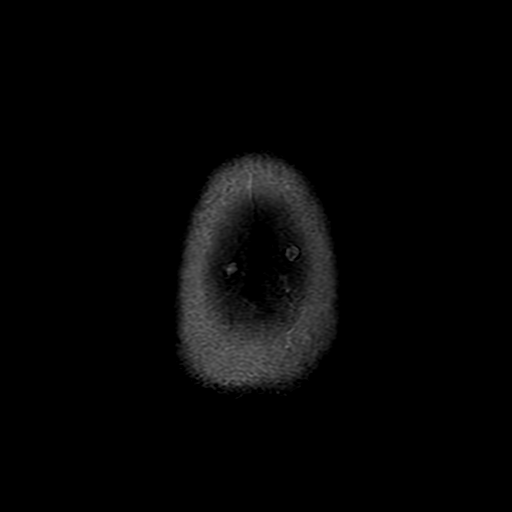

[Series 6: FLAIR · sagittal · 5.0mm · 0.47mm/px · 1 of 23 slices shown (2 of 3)]
[im 1/23]
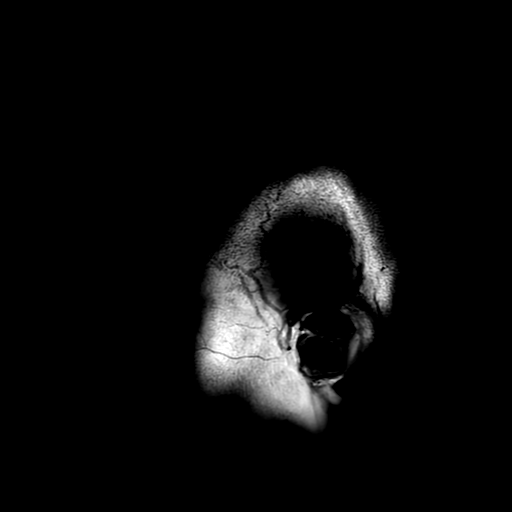

[Series 10: FLAIR · sagittal · 1.6mm · 0.49mm/px · 9 of 216 slices shown (3 of 3)]
[im 1/216]
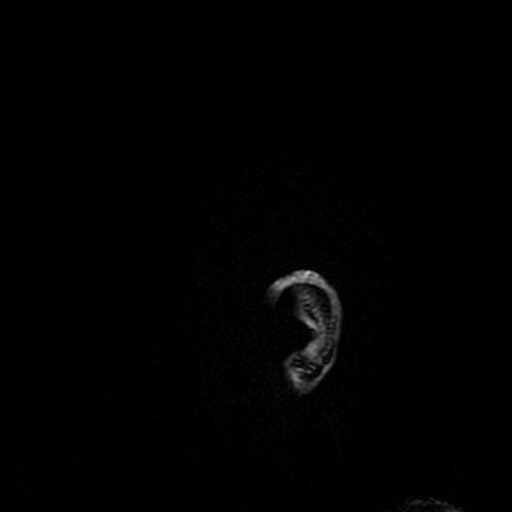
[im 40/216]
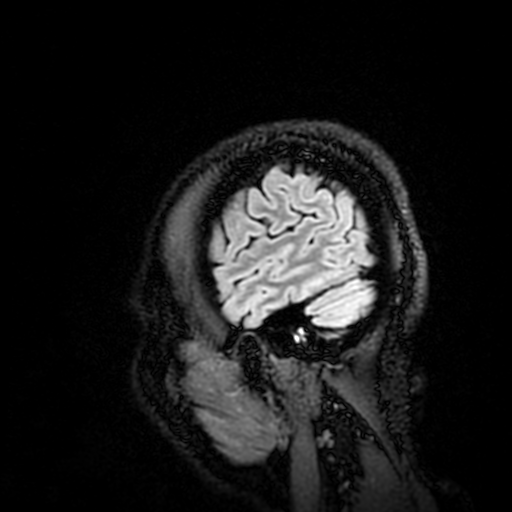
[im 59/216]
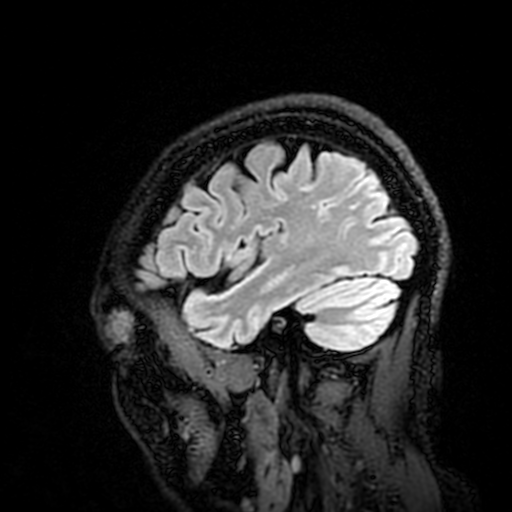
[im 98/216]
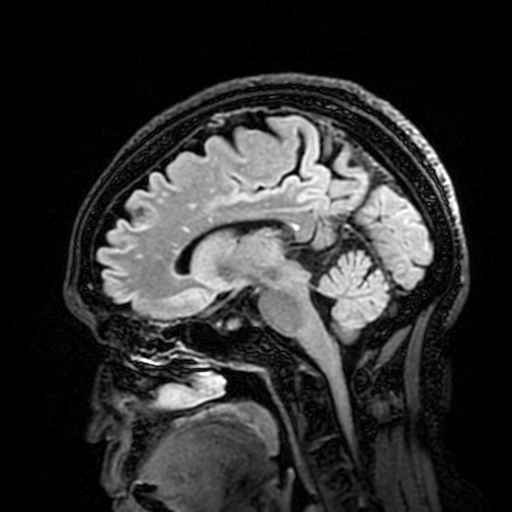
[im 118/216]
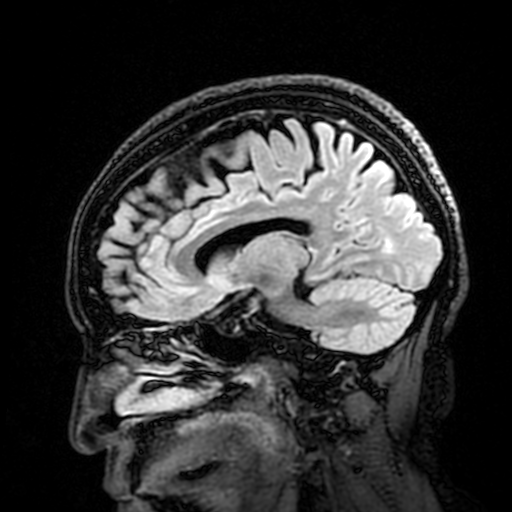
[im 157/216]
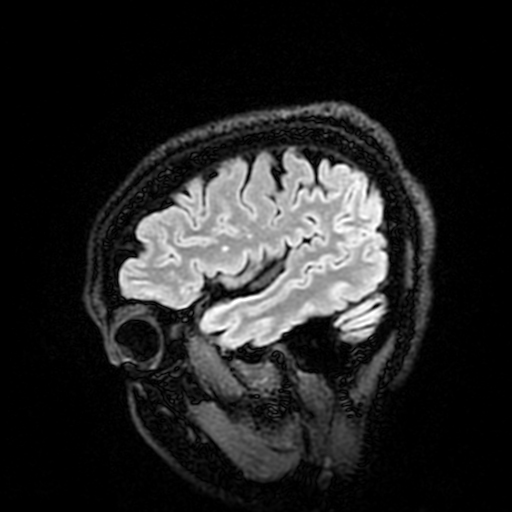
[im 176/216]
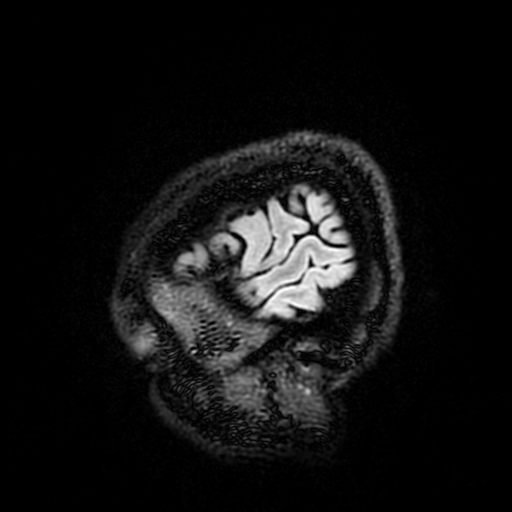
[im 196/216]
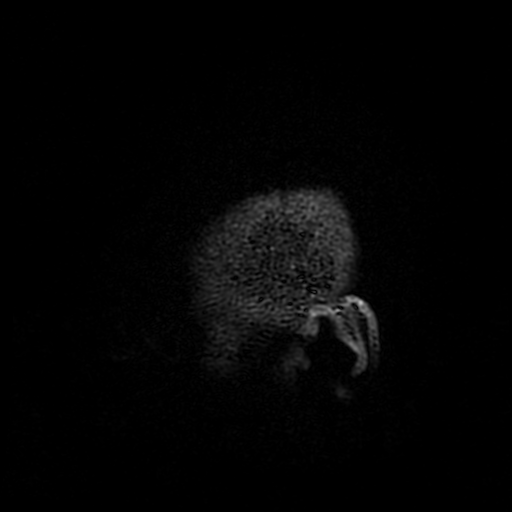
[im 216/216]
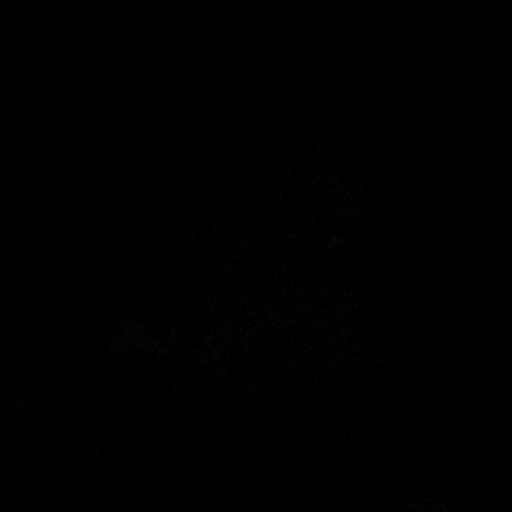

[Series 250: ADC · axial · 3.0mm · 0.94mm/px · z∈[-124,+19]mm · 3 of 50 slices shown (1 of 2)]
[im 1/50]
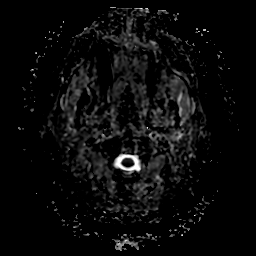
[im 25/50]
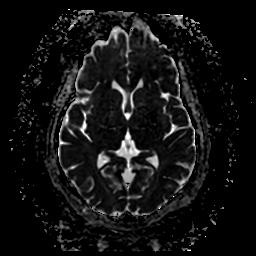
[im 50/50]
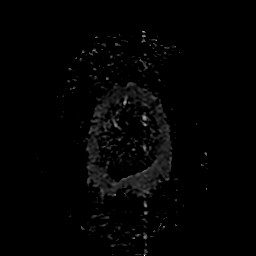

[Series 350: ADC · coronal · 4.0mm · 0.94mm/px · 2 of 37 slices shown (2 of 2)]
[im 1/37]
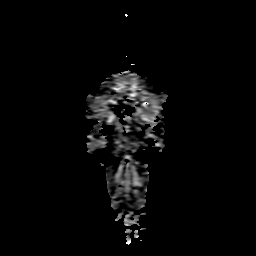
[im 37/37]
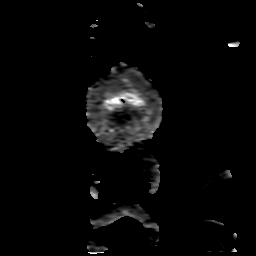

[27 of 48 positions shown; findings below may reference images not displayed]

FINDINGS: Brain: There is no evidence of an acute infarct, intracranial
hemorrhage, mass, midline shift, or extra-axial fluid collection.
The ventricles and sulci are normal. Small T2 hyperintensities in
the cerebral white matter bilaterally are moderately advanced for
age and nonspecific but compatible with chronic small vessel
ischemic disease.

Vascular: Major intracranial vascular flow voids are preserved.

Skull and upper cervical spine: Unremarkable bone marrow signal.

Sinuses/Orbits: Unremarkable orbits. Clear paranasal sinuses. Trace
left mastoid effusion.

Other: None.
IMPRESSION: 1. No acute intracranial abnormality.
2. Moderately age advanced chronic small vessel ischemic disease.

## 2021-02-22 MED ORDER — ENOXAPARIN SODIUM 40 MG/0.4ML IJ SOSY
40.0000 mg | PREFILLED_SYRINGE | INTRAMUSCULAR | Status: DC
  Administered 2021-02-22: 40 mg via SUBCUTANEOUS
  Filled 2021-02-22: qty 0.4

## 2021-02-22 MED ORDER — IOHEXOL 350 MG/ML SOLN
50.0000 mL | Freq: Once | INTRAVENOUS | Status: AC | PRN
  Administered 2021-02-22: 50 mL via INTRAVENOUS

## 2021-02-22 MED ORDER — SODIUM CHLORIDE 0.9 % IV BOLUS
1000.0000 mL | Freq: Once | INTRAVENOUS | Status: AC
  Administered 2021-02-22: 1000 mL via INTRAVENOUS

## 2021-02-22 MED ORDER — ACETAMINOPHEN 325 MG PO TABS: 650.0000 mg | ORAL_TABLET | ORAL | Status: DC | PRN

## 2021-02-22 MED ORDER — ACETAMINOPHEN 160 MG/5ML PO SOLN: 650.0000 mg | ORAL | Status: DC | PRN

## 2021-02-22 MED ORDER — SENNOSIDES-DOCUSATE SODIUM 8.6-50 MG PO TABS: 1.0000 | ORAL_TABLET | Freq: Every evening | ORAL | Status: DC | PRN

## 2021-02-22 MED ORDER — ACETAMINOPHEN 650 MG RE SUPP: 650.0000 mg | RECTAL | Status: DC | PRN

## 2021-02-22 MED ORDER — STROKE: EARLY STAGES OF RECOVERY BOOK
Freq: Once | Status: AC
  Filled 2021-02-22: qty 1

## 2021-02-22 MED ORDER — SODIUM CHLORIDE 0.9% FLUSH: 3.0000 mL | Freq: Once | INTRAVENOUS | Status: DC

## 2021-02-22 NOTE — ED Notes (Signed)
arrived in room from MRI, ED provider at bedside.

## 2021-02-22 NOTE — H&P (Signed)
Date: 02/22/2021               Patient Name:  Mary Massey MRN: [id number]  DOB: Aug 18, 1965 Age / Sex: 55 y.o., female   PCP: Patient, No Pcp Per (Inactive)         Medical Service: Internal Medicine Teaching Service         Attending Physician: Dr. Lorre Nick, MD    First Contact: Dr. Welton Flakes Pager: [phone]  Second Contact: Dr. Ephriam Knuckles Pager: [phone]       After Hours (After 5p/  First Contact Pager: [phone]  weekends / holidays): Second Contact Pager: [phone]   Chief Complaint: weakness  History of Present Illness: Patient is 55 year old female with no significant past medical history presenting with generalized numbness and weakness. The patient states she was well last time at 830 am today but then she got episodes of weakness, dizziness along with numbness in her body. No specific location was isolated. She stated it got worse as time went on as she tried to pray her way through some of the symptoms. She states the symptoms got worse where she fell once before EMS arrived and then when EMS told her to stand she fell again. She did not hit her head either times. She stated her symptoms have mostly resolved with residual tingling and numbness left. When asked if she was adequately hydrated she stated she has been been busy in the last 3 days and had visited 150 houses as part of her church. She stated she has been out in the sun a lot as well. She states she has been getting dizziness/lightheadedness with postural changes today. She endorsed blurry vision this past week that comes and goes. Otherwise the patient endorsed being very healthy and denied other symptoms.   Review of Systems: Review of Systems  Constitutional:  Positive for malaise/fatigue. Negative for chills, fever and weight loss.  HENT:  Negative for hearing loss, sinus pain, sore throat and tinnitus.   Eyes:  Positive for blurred vision (recent episode of blurry vision). Negative for double vision.   Respiratory:  Negative for cough, shortness of breath and wheezing.   Cardiovascular:  Negative for chest pain, palpitations and leg swelling.  Gastrointestinal:  Negative for abdominal pain, blood in stool, constipation, diarrhea, heartburn, nausea and vomiting.  Genitourinary:  Negative for dysuria, frequency and urgency.  Musculoskeletal:  Positive for back pain (low back pain) and joint pain (finger and left shoulder). Negative for myalgias.  Skin:  Positive for rash. Negative for itching.  Neurological:  Positive for tingling and focal weakness (right leg). Negative for dizziness, loss of consciousness and headaches.  Psychiatric/Behavioral:  Positive for depression. Negative for hallucinations and suicidal ideas. The patient is nervous/anxious. The patient does not have insomnia.   Meds: Natural supplements and vitamins including Vit B12, C, multivitamin, magnesium. Flaxseed and turmeric as well.  No outpatient medications have been marked as taking for the 02/22/21 encounter [facility] Encounter).     Allergies: Erythromycin  Allergies as of 02/22/2021   (No Known Allergies)   Past Medical History:  Diagnosis Date   Anxiety     Family History: as below Family History  Problem Relation Age of Onset   Rectal cancer Mother    Pancreatic cancer Sister    Diabetes Sister    Esophageal cancer Brother    Diabetes Paternal Grandfather    Social History: She lives with her husband and has a Nurse, mental health. Denies  smoking, alcohol or illicit substance use.  Social History   Socioeconomic History   Marital status: Married    Spouse name: Not on file   Number of children: Not on file   Years of education: Not on file   Highest education level: Not on file  Occupational History   Not on file  Tobacco Use   Smoking status: Never   Smokeless tobacco: Never  Vaping Use   Vaping Use: Never used  Substance and Sexual Activity   Alcohol use: Never   Drug use: Never   Sexual activity: Not  on file  Other Topics Concern   Not on file  Social History Narrative   Not on file   Social Determinants of Health   Financial Resource Strain: Not on file  Food Insecurity: Not on file  Transportation Needs: Not on file  Physical Activity: Not on file  Stress: Not on file  Social Connections: Not on file  Intimate Partner Violence: Not on file    Physical Exam: Blood pressure 109/88, pulse 69, temperature 98.1 F (36.7 C), temperature source Oral, resp. rate 11, weight 89.2 kg, SpO2 100 %. Physical Exam Constitutional:      Appearance: Normal appearance.  HENT:     Head: Normocephalic and atraumatic.     Right Ear: External ear normal.     Left Ear: External ear normal.     Nose: Nose normal.     Mouth/Throat:     Mouth: Mucous membranes are moist.     Pharynx: Oropharynx is clear. No posterior oropharyngeal erythema.  Eyes:     Extraocular Movements: Extraocular movements intact.     Pupils: Pupils are equal, round, and reactive to light.  Cardiovascular:     Rate and Rhythm: Normal rate and regular rhythm.     Pulses: Normal pulses.     Heart sounds: Normal heart sounds.  Pulmonary:     Effort: Pulmonary effort is normal.     Breath sounds: Normal breath sounds.  Abdominal:     General: Bowel sounds are normal.     Palpations: Abdomen is soft.     Tenderness: There is no abdominal tenderness.  Musculoskeletal:        General: No tenderness. Normal range of motion.     Cervical back: Normal range of motion and neck supple.     Right lower leg: No edema.     Left lower leg: No edema.  Skin:    General: Skin is warm and dry.     Capillary Refill: Capillary refill takes less than 2 seconds.     Findings: Rash present. No erythema.  Neurological:     General: No focal deficit present.     Mental Status: She is alert and oriented to person, place, and time.     Cranial Nerves: No cranial nerve deficit.     Sensory: No sensory deficit.     Comments: CN II-XII  intact Finger to nose intact RUE=5/5, LUE 5/5 LLE 5/5, RLE 4/5 Grip strength 5/5 bilaterally Weak abduction of 4th and 5th finger of left hand  Psychiatric:        Mood and Affect: Mood normal.        Behavior: Behavior normal.     EKG: personally reviewed my interpretation is  CXR: No CXR indicated  CBC Latest Ref Rng & Units 02/22/2021 02/22/2021 05/02/2019  WBC 4.0 - 10.5 K/uL - 5.7 6.1  Hemoglobin 12.0 - 15.0 g/dL 11.2(L) 11.1(L) 11.4(L)  Hematocrit 36.0 - 46.0 % 33.0(L) 33.7(L) 37.5  Platelets 150 - 400 K/uL - 157 199    CMP Latest Ref Rng & Units 02/22/2021 02/22/2021 05/02/2019  Glucose 70 - 99 mg/dL 95 102(T) 88  BUN 6 - 20 mg/dL 11(N) 17 17  Creatinine 0.44 - 1.00 mg/dL 3.56(P) 0.14(D) 0.30(D)  Sodium 135 - 145 mmol/L 140 139 140  Potassium 3.5 - 5.1 mmol/L 3.9 4.1 3.9  Chloride 98 - 111 mmol/L 107 106 103  CO2 22 - 32 mmol/L - 22 24  Calcium 8.9 - 10.3 mg/dL - 9.5 9.3  Total Protein 6.5 - 8.1 g/dL - 7.0 -  Total Bilirubin 0.3 - 1.2 mg/dL - 1.1 -  Alkaline Phos 38 - 126 U/L - 65 -  AST 15 - 41 U/L - 30 -  ALT 0 - 44 U/L - 15 -    Assessment & Plan by Problem:  Numbness/Weakness Patient has sudden onset of generalized numbness and weakness that has been resolving since onset. Ddx include TIA vs stroke vs heat exhaustion vs anxiety vs dehydration. Neuro was consulted as this was suspected stroke due to weakness. MRI and CTA showed no stroke. It however showed some atherosclerotic changes advanced for age. Patients weakness and numbness has been improving since onset. She endorsed high exposure to heat and not enough water intake recently which could explain her lightheadedness upon movement. Although a negative imaging can not rule out stroke, her symptoms appear to be improving making that less likely. Dehydration causing orthostatic is likely as patient endorsed a suggestive history and creatinine was elevated.   Plan: -Neuro follow  -They do not believe this is a TIA  or stroke. Will follow up any further recs.  -Aspirin 81 mg -Lipid panel -A1c -Orthostatic vitals -Vitamin D, B12 -Continue to monitor  Anxiety Patient does appear anxious especially about health. She endorses a lot of stress and states she is a busy person. She however does not want to treat with medicine but rather through natural methods. She states she does not want to take any medicine as I mentioned to her that her cholesterol was higher than normal. She stated she wanted to give diet and exercise a try. She stated if she is to go on a medicine she would like the lowest dose with least side effects.   Plan: -Continue to monitor   DVT prophx: Ambulation Diet: Cardiac Bowel:prn Code:DNR  Prior to Admission Living Arrangement:Home Anticipated Discharge Location:Home Barriers to Discharge:Medical workup  Dispo: Admit patient to Observation with expected length of stay less than 2 midnights.  Signed: Gwenevere Abbot, MD 02/22/2021, 3:39 PM  Pager: [phone]

## 2021-02-22 NOTE — Consult Note (Signed)
Neurology Consultation Reason for Consult: Code stroke for right-sided weakness Requesting Physician: Lorre Nick  CC: Acutely feeling unwell particularly towards the right  History is obtained from: Patient, EMS and chart review  HPI: Mary Massey is a 55 y.o. female with a past medical history significant for chronic medical issues, possible anxiety.  She reports that she was at church when she began to have total body numbness and felt like her right hand was moving with a dystonic type posturing inwards felt off balance and felt right greater than left leg weakness.  She had to sit down on the ground due to feeling generally unwell.  Heart rates were noted to be in the 50s by EMS and she is unsure of her baseline heart rates.  She additionally has had intermittent blurred vision lasting about 30 seconds at a time.  She felt completely normal and well at 8:30 AM but felt the symptom onset around 9 AM  She notes she recently started a new supplement that she has been taking for the past 21 days called "nullify" which she reports is a powder that is meant to help with leaky bowel.  She reports no other medications  LKW: 8:30 AM tPA given?: No, MRI confirms no stroke IA performed?: No, no LVO Premorbid modified Rankin scale     0 - No symptoms.  Review of systems was negative except as noted in the HPI.   Past Medical History:  Diagnosis Date   Anxiety     No family history on file.   Social History:  reports that she has never smoked. She has never used smokeless tobacco. She reports that she does not drink alcohol and does not use drugs.  Exam: Current vital signs: BP 131/84 (BP Location: Left Arm)   Pulse (!) 58   Temp 98.2 F (36.8 C) (Oral)   Resp 18   Wt 89.2 kg   SpO2 100%   BMI 29.90 kg/m  Vital signs in last 24 hours: Temp:  [98.2 F (36.8 C)] 98.2 F (36.8 C) (08/27 1150) Pulse Rate:  [51-71] 58 (08/27 1150) Resp:  [18] 18 (08/27 1150) BP:  (112-131)/(82-98) 131/84 (08/27 1150) SpO2:  [100 %] 100 % (08/27 1150) Weight:  [89.2 kg] 89.2 kg (08/27 1035)   Physical Exam  Constitutional: Appears well-developed and well-nourished.  Psych: Intermittently anxious but cooperative and pleasant Eyes: No scleral injection HENT: No oropharyngeal obstruction.  MSK: no joint deformities.  Cardiovascular: Normal rate and regular rhythm.  Respiratory: Effort normal, non-labored breathing GI: Soft.  No distension. There is no tenderness.  Skin: Warm dry and intact visible skin  Neuro: Mental Status: Patient is awake, alert, oriented to person, place, month, year, and situation. Patient is able to give a clear and coherent history. No signs of aphasia or neglect Cranial Nerves: II: Visual Fields are full. Pupils are equal, round, and reactive to light.  III,IV, VI: EOMI without ptosis although she endorses some blurry vision that she reports is slightly worse on left gaze.  V: Facial sensation is reduced on the right side VII: Facial movement is notable for slight right facial droop/delayed activation VIII: hearing is intact to voice X: Uvula elevates symmetrically XI: Shoulder shrug is symmetric. XII: tongue is midline without atrophy or fasciculations.  Motor: Tone is normal. Bulk is normal.  Strength exam is very inconsistent.  Initially he would let the right upper extremity drift straight to the bed then she was holding it antigravity.  On  casual nonformally tested movements she moves both upper extremities equally (for example showing the CT techs how to remove her earrings).  Initially she had poor effort with bilateral lower extremities, with both of them drifting without hitting the bed.  Later she was able to maintain both antigravity.   Intermittently she would have some flexor posturing of the right hand which she reported was a spasm that she could not control well.  She would also has some right lower extremity  stiffness Sensory: She reported tingling of the right upper extremity Deep Tendon Reflexes: 2+ and symmetric in the biceps and patellae.  Cerebellar: She performed heel-to-shin bilaterally with excellent motor control, better than would have been expected given her weakness Finger-nose is intact bilaterally  NIHSS total 7  Score breakdown: One-point for mild right facial droop, one-point for right arm weakness, one-point for left leg weakness, 2 points for right leg weakness, one-point for right upper extremity sensory loss, one-point for dysarthria, most of the symptoms rapidly improving or inconsistent as described above   I have reviewed labs in epic and the results pertinent to this consultation are: Creatinine 1.22, GFR 52, CBC with mild normocytic anemia at 11.1  I have reviewed the images obtained: Head CT negative for acute intracranial process CTA negative for LVO MRI brain negative for acute stroke, moderate burden of chronic white matter changes, nonspecific   Impression: This is a 55 year old woman with past medical history significant for anxiety presenting with initially generalized numbness weakness, worse on the right than the left.  Given there was significant facial involvement this would localize to the brain.  Fortunately advancing as imaging was negative for acute intracranial process.  She did have some dystonic appearing posturing which can be followed up on an outpatient basis.  Symptoms and time course are inconsistent with TIA, though given she was presenting within a tPA treatment window and functional overlay can obscure an underlying biological process, MRI brain was obtained to confirm there is no acute process  Code stroke Recommendations: - CTA to rule out brainstem dysfunction - MRI brain to rule out stroke given within the tPA window   Additional recommendations - Bradycardia work-up per ED/primary team  Brooke Dare MD-PhD [facility] [phone] Available 7 AM to 7 PM, outside these hours please contact Neurologist on call listed on AMION   Total critical care time: 45 minutes   Critical care time was exclusive of separately billable procedures and treating other patients.   Critical care was necessary to treat or prevent imminent or life-threatening deterioration, concern for acute neurological changes with consideration of high risk medication requiring close neuro monitoring and advanced medical decision-making   Critical care was time spent personally by me on the following activities: development of treatment plan with patient and/or surrogate as well as nursing, discussions with consultants/primary team, evaluation of patient's response to treatment, examination of patient, obtaining history from patient or surrogate, ordering and performing treatments and interventions, ordering and review of laboratory studies, ordering and review of radiographic studies, and re-evaluation of patient's condition as needed, as documented above.

## 2021-02-22 NOTE — ED Notes (Signed)
Remain with patient in imaging

## 2021-02-22 NOTE — ED Triage Notes (Signed)
LKW 0830. Approx. 0900 total body numbness, then loss of balance, right leg weakness and right hand muscle spasms, no thinners. 1010 blurred vision and dizziness.

## 2021-02-22 NOTE — ED Notes (Signed)
ED Provider at bedside. 

## 2021-02-22 NOTE — ED Notes (Signed)
In MRI scanner

## 2021-02-22 NOTE — ED Provider Notes (Signed)
[facility]   CSN: [id number] Arrival date & time: 02/22/21  1026     History Chief Complaint  Patient presents with   Code Stroke    Mary Massey is a 55 y.o. female.  HPI  Patient is a 55 year old female with no significant past medical history that is presenting as a tier 1 code stroke.  Patient states that this morning she was working [employer] event when she began to feel paresthesias throughout her entire body, loss of balance and right lower extremity weakness. She states that she initially felt full body numbness and felt like her right hand was moving. She tried to walk but her right leg went weak and numb. She called EMS. Her last known normal was at 8:30 am. She did not hit her head. She endorsed blurred vision with the weakness today.   Patient denies any fevers, chills, headache, chest pain, neck stiffness, recent illness, shortness of breath, nausea, vomiting, diarrhea.      Past Medical History:  Diagnosis Date   Anxiety     There are no problems to display for this patient.   Past Surgical History:  Procedure Laterality Date   ABDOMINAL HYSTERECTOMY       OB History   No obstetric history on file.     No family history on file.  Social History   Tobacco Use   Smoking status: Never   Smokeless tobacco: Never  Substance Use Topics   Alcohol use: Never   Drug use: Never    Home Medications Prior to Admission medications   Not on File    Allergies    Patient has no known allergies.  Review of Systems   Review of Systems  Constitutional:  Negative for chills, diaphoresis, fatigue and fever.  HENT:  Negative for congestion, dental problem, ear discharge, ear pain, facial swelling, hearing loss, nosebleeds, postnasal drip, rhinorrhea, sinus pain, sneezing, sore throat and trouble swallowing.   Eyes:  Negative for pain and visual disturbance.  Respiratory:  Negative for cough,  chest tightness, shortness of breath, wheezing and stridor.   Cardiovascular:  Negative for chest pain, palpitations and leg swelling.  Gastrointestinal:  Negative for abdominal distention, abdominal pain, blood in stool, constipation, diarrhea, nausea and vomiting.  Endocrine: Negative for polydipsia and polyuria.  Genitourinary:  Negative for difficulty urinating, dysuria, flank pain, frequency, hematuria, urgency, vaginal bleeding and vaginal discharge.  Musculoskeletal:  Negative for myalgias, neck pain and neck stiffness.  Skin:  Negative for rash and wound.  Allergic/Immunologic: Negative for environmental allergies and food allergies.  Neurological:  Positive for weakness, light-headedness and numbness. Negative for dizziness, seizures, syncope, facial asymmetry, speech difficulty and headaches.  Psychiatric/Behavioral:  Negative for agitation, behavioral problems and confusion.    Physical Exam Updated Vital Signs BP (!) 134/94 (BP Location: Right Arm)   Pulse (!) 52   Temp 98.2 F (36.8 C) (Oral)   Resp 18   Wt 89.2 kg   SpO2 100%   BMI 29.90 kg/m   Physical Exam Vitals and nursing note reviewed.  Constitutional:      General: She is not in acute distress.    Appearance: Normal appearance. She is normal weight. She is not ill-appearing.  HENT:     Head: Normocephalic and atraumatic.     Right Ear: External ear normal.     Left Ear: External ear normal.     Nose: Nose normal. No congestion.  Mouth/Throat:     Mouth: Mucous membranes are moist.     Pharynx: Oropharynx is clear. No oropharyngeal exudate or posterior oropharyngeal erythema.  Eyes:     General: No visual field deficit.    Extraocular Movements: Extraocular movements intact.     Conjunctiva/sclera: Conjunctivae normal.     Pupils: Pupils are equal, round, and reactive to light.  Neck:     Vascular: No carotid bruit.  Cardiovascular:     Rate and Rhythm: Normal rate and regular rhythm.     Pulses:  Normal pulses.     Heart sounds: Normal heart sounds. No murmur heard. Pulmonary:     Effort: Pulmonary effort is normal. No respiratory distress.     Breath sounds: Normal breath sounds. No stridor. No wheezing, rhonchi or rales.  Chest:     Chest wall: No tenderness.  Abdominal:     General: Bowel sounds are normal. There is no distension.     Palpations: Abdomen is soft.     Tenderness: There is no abdominal tenderness. There is no right CVA tenderness, left CVA tenderness, guarding or rebound.  Musculoskeletal:        General: No swelling or tenderness. Normal range of motion.     Cervical back: Normal range of motion and neck supple. No rigidity, tenderness or bony tenderness.     Thoracic back: Normal. No tenderness or bony tenderness.     Lumbar back: Normal. No tenderness or bony tenderness.     Right lower leg: No edema.     Left lower leg: No edema.  Skin:    General: Skin is warm and dry.     Coloration: Skin is not jaundiced.  Neurological:     General: No focal deficit present.     Mental Status: She is alert and oriented to person, place, and time. Mental status is at baseline.     Cranial Nerves: Cranial nerves are intact. No cranial nerve deficit, dysarthria or facial asymmetry.     Sensory: Sensation is intact. No sensory deficit.     Motor: Motor function is intact. No weakness.     Coordination: Coordination is intact. Finger-Nose-Finger Test normal.     Gait: Gait is intact. Gait normal.     Comments: Right lower extremity weakness  Psychiatric:        Mood and Affect: Mood normal.        Behavior: Behavior normal.        Thought Content: Thought content normal.        Judgment: Judgment normal.    ED Results / Procedures / Treatments   Labs (all labs ordered are listed, but only abnormal results are displayed) Labs Reviewed  CBC - Abnormal; Notable for the following components:      Result Value   RBC 3.85 (*)    Hemoglobin 11.1 (*)    HCT 33.7 (*)     All other components within normal limits  COMPREHENSIVE METABOLIC PANEL - Abnormal; Notable for the following components:   Glucose, Bld 100 (*)    Creatinine, Ser 1.22 (*)    GFR, Estimated 52 (*)    All other components within normal limits  CBG MONITORING, ED - Abnormal; Notable for the following components:   Glucose-Capillary 101 (*)    All other components within normal limits  PROTIME-INR  APTT  DIFFERENTIAL  I-STAT CHEM 8, ED  I-STAT BETA HCG BLOOD, ED ([facility], [facility], [facility] ONLY)    EKG None  Radiology  CT HEAD CODE STROKE WO CONTRAST  Result Date: 02/22/2021 CLINICAL DATA:  Code stroke.  55 year old female EXAM: CT HEAD WITHOUT CONTRAST TECHNIQUE: Contiguous axial images were obtained from the base of the skull through the vertex without intravenous contrast. COMPARISON:  None. FINDINGS: Brain: No midline shift, ventriculomegaly, mass effect, evidence of mass lesion, intracranial hemorrhage or evidence of cortically based acute infarction. Minimal anterior frontal lobe subcortical white matter hypodensity. Otherwise normal gray-white matter differentiation in both hemispheres and the posterior fossa. Vascular: No suspicious intracranial vascular hyperdensity. Skull: Negative. Sinuses/Orbits: Visualized paranasal sinuses and mastoids are well aerated. Other: Visualized orbits and scalp soft tissues are within normal limits. ASPECTS [facility] Stroke Program Early CT Score) Total score (0-10 with 10 being normal): 10 IMPRESSION: 1. Largely normal for age noncontrast CT appearance of the brain; minimal white matter changes. ASPECTS 10. 2. These results were communicated to Dr. Iver Nestle at 10:44 am on 02/22/2021 by text page via the [facility] messaging system. Electronically Signed   By: Odessa Fleming M.D.   On: 02/22/2021 10:44   CT ANGIO HEAD NECK W WO CM (CODE STROKE)  Result Date: 02/22/2021 CLINICAL DATA:  Code stroke. 55 year old female EXAM: CT ANGIOGRAPHY HEAD AND NECK TECHNIQUE: Multidetector CT  imaging of the head and neck was performed using the standard protocol during bolus administration of intravenous contrast. Multiplanar CT image reconstructions and MIPs were obtained to evaluate the vascular anatomy. Carotid stenosis measurements (when applicable) are obtained utilizing NASCET criteria, using the distal internal carotid diameter as the denominator. CONTRAST:  9mL OMNIPAQUE IOHEXOL 350 MG/ML SOLN COMPARISON:  Plain head CT 1042 hours today. FINDINGS: CTA NECK Skeleton: Mild chronic appearing C7 superior endplate irregularity. No acute osseous abnormality identified. Upper chest: Negative. Other neck: Negative. Aortic arch: 3 vessel arch configuration.  No arch atherosclerosis. Right carotid system: Negative. Left carotid system: Subtle mixing artifact suspected at the lateral left CCA origin. Minimal soft and calcified plaque at the posterior left ICA origin. Otherwise negative. No stenosis. Vertebral arteries: Right vertebral artery is dominant and normal to the skull base. Diminutive left vertebral artery has a normal origin and is patent although highly diminutive to the skull base. CTA HEAD Posterior circulation: Non dominant left vertebral artery functionally terminates in PICA. Patent right V4 segment without stenosis primarily supplying the basilar. Normal right PICA origin. Patent basilar artery without stenosis. Fetal right PCA origin. AICA, SCA and left PCA origins are normal. Small left posterior communicating artery also. Bilateral PCA branches are within normal limits. Anterior circulation: Both ICA siphons are patent without stenosis. Normal right posterior communicating artery origin. Patent carotid termini, MCA and ACA origins. Anterior communicating artery and bilateral ACA branches are within normal limits. Left MCA M1 segment and bifurcation are patent without stenosis. Right MCA M1 segment and bifurcation are patent without stenosis. Bilateral MCA branches are within normal  limits. Venous sinuses: Early contrast timing, but the major dural venous sinuses appear to be normally patent. Anatomic variants: Dominant right vertebral artery, the left is diminutive and functionally terminates in PICA. Fetal right PCA origin. Review of the MIP images confirms the above findings IMPRESSION: 1. Negative for large vessel occlusion. 2. Minimal atherosclerosis - primarily at the left ICA origin - with no arterial stenosis in the head or neck. These results were communicated to Dr. Iver Nestle at 11:05 am on 02/22/2021 by text page via the [facility] messaging system. Electronically Signed   By: Odessa Fleming M.D.   On: 02/22/2021 11:06  Procedures Procedures   Medications Ordered in ED Medications  sodium chloride flush (NS) 0.9 % injection 3 mL (has no administration in time range)  iohexol (OMNIPAQUE) 350 MG/ML injection 50 mL (50 mLs Intravenous Contrast Given 02/22/21 1053)    ED Course  I have reviewed the triage vital signs and the nursing notes.  Pertinent labs & imaging results that were available during my care of the patient were reviewed by me and considered in my medical decision making (see chart for details).    MDM Rules/Calculators/A&P                         Anne Jenille Laszlo is a 55 y.o. female that is presenting as code stroke due to full body numbess, loss of balance and right leg weakness. Patient is hemodynamically stable and in no acute distress. Neurology was at the bedside on arrival. CT head showed no acute intracranial abnormality. CTA head and neck showed no large vessel occlusion. MRI showed No acute intracranial abnormality. Moderately age advanced chronic small vessel ischemic disease.  EKG showed sinus bradycardia. On my evaluation of the patient she continues to have right lower extremity weakness and numbness sensation to her face. She states that she is not at her baseline. Her labs are unremarkable.   Patient given IV fluids. On my reevaluation of the  patient she continues to feel weak in her right lower extremity and has difficulty ambulating. She is not back at her baseline. I discussed this with neurology.   Patient will be admitted to internal medicine for further evaluation.   Patient states compliance and understanding of the plan. I explained labs and imaging to the patient. No further questions at this time from the patient.  The plan for this patient was discussed with Dr. Freida Busman, who voiced agreement and who oversaw evaluation and treatment of this patient.    Final Clinical Impression(s) / ED Diagnoses Final diagnoses:  None  Weakness  Rx / DC Orders ED Discharge Orders     None        Lottie Dawson, MD 02/22/21 1854    Lorre Nick, MD 02/25/21 1450

## 2021-02-22 NOTE — ED Notes (Signed)
Ambulated to BR and back to bed, still with weakness right leg but able to bear weight using walker.

## 2021-02-22 NOTE — ED Notes (Signed)
Provider at bedside

## 2021-02-22 NOTE — ED Provider Notes (Signed)
I saw and evaluated the patient, reviewed the resident's note and I agree with the findings and plan.  EKG Interpretation  Date/Time:  Saturday February 22 2021 12:27:05 EDT Ventricular Rate:  49 PR Interval:  232 QRS Duration: 111 QT Interval:  477 QTC Calculation: 431 R Axis:   3 Text Interpretation: Sinus bradycardia Prolonged PR interval Low voltage, extremity leads Confirmed by Lorre Nick ([id number]) on 02/22/2021 12:63:29 PM   55 year old female presents with presents as code stroke due to body numbness as well as loss of balance or right leg weakness.  Leg weakness has improved.  Seen by neurology and MRI of brain and CT of the head were negative.  Will likely admit for TIA work-up   Lorre Nick, MD 02/22/21 1304

## 2021-02-23 ENCOUNTER — Inpatient Hospital Stay (HOSPITAL_COMMUNITY)

## 2021-02-23 ENCOUNTER — Other Ambulatory Visit: Payer: Self-pay

## 2021-02-23 DIAGNOSIS — R29818 Other symptoms and signs involving the nervous system: Secondary | ICD-10-CM | POA: Diagnosis present

## 2021-02-23 DIAGNOSIS — R7303 Prediabetes: Secondary | ICD-10-CM | POA: Diagnosis present

## 2021-02-23 DIAGNOSIS — E785 Hyperlipidemia, unspecified: Secondary | ICD-10-CM | POA: Diagnosis present

## 2021-02-23 DIAGNOSIS — G459 Transient cerebral ischemic attack, unspecified: Secondary | ICD-10-CM

## 2021-02-23 LAB — ECHOCARDIOGRAM COMPLETE
Area-P 1/2: 3.08 cm2
S' Lateral: 2.8 cm
Weight: 3146.41 oz

## 2021-02-23 LAB — IRON AND TIBC
Iron: 68 ug/dL (ref 28–170)
Saturation Ratios: 23 % (ref 10.4–31.8)
TIBC: 297 ug/dL (ref 250–450)
UIBC: 229 ug/dL

## 2021-02-23 LAB — FERRITIN: Ferritin: 76 ng/mL (ref 11–307)

## 2021-02-23 MED ORDER — ROSUVASTATIN CALCIUM 5 MG PO TABS: 5.0000 mg | ORAL_TABLET | Freq: Every day | ORAL | 0 refills | Status: AC

## 2021-02-23 MED ORDER — ROSUVASTATIN CALCIUM 5 MG PO TABS
5.0000 mg | ORAL_TABLET | Freq: Every day | ORAL | Status: DC
  Administered 2021-02-23: 5 mg via ORAL
  Filled 2021-02-23: qty 1

## 2021-02-23 MED ORDER — ASPIRIN 81 MG PO CHEW
81.0000 mg | CHEWABLE_TABLET | Freq: Every day | ORAL | Status: DC
  Administered 2021-02-23: 81 mg via ORAL
  Filled 2021-02-23: qty 1

## 2021-02-23 MED ORDER — ASPIRIN 81 MG PO CHEW: 81.0000 mg | CHEWABLE_TABLET | Freq: Every day | ORAL | 0 refills | Status: AC

## 2021-02-23 NOTE — Progress Notes (Signed)
  Echocardiogram 2D Echocardiogram has been performed.  Augustine Radar 02/23/2021, 1:28 PM

## 2021-02-23 NOTE — Discharge Summary (Addendum)
Name: Mary Massey MRN: [id number] DOB: [birth year] 55 y.o. PCP: Patient, No Pcp Per (Inactive)  Date of Admission: 02/22/2021 10:28 AM Date of Discharge: 02/23/21 Attending Physician: Gust Rung, DO  Discharge Diagnosis: 1. Heat Exhaustion 2. Hyperlipidemia 3. Prediabetes 4. Bradycardia  Discharge Medications: Allergies as of 02/23/2021       Reactions   Azithromycin Rash        Medication List     TAKE these medications    aspirin 81 MG chewable tablet Chew 1 tablet (81 mg total) by mouth daily. Start taking on: February 24, 2021   rosuvastatin 5 MG tablet Commonly known as: CRESTOR Take 1 tablet (5 mg total) by mouth daily. Start taking on: February 24, 2021   Voltaren 1 % Gel Generic drug: diclofenac Sodium Apply 2 g topically daily.        Disposition and follow-up:   [name] was discharged from [facility] in Good condition.  At the hospital follow up visit please address:  Weakness/Numbness Patient had sudden onset of weakness and numbness and TIA vs heat exhaustion was considered; Patient has symptoms inconsistent with ischemic stroke as they were generalized but had risk factors present for stroke such as obesity, hyperlipidemia, and prediabetes. Imaging findings were negative for stroke but showed atherosclerotic changes in the brain. History and physical was consistent with symptoms due to heat exhaustion but cannot ignore risk factors for stroke. Aspirin 81 mg and Crestor 5 mg were added to patient's regimen. Neuro outpatient follow up. Please ensure these recommendations were followed.    Anxiety Patient has anxiety at baseline that needs to be evaluated with outpatient screening tool.   HLD Patient had lipid panel performed at showing total cholesterol of 229 and LDL of 148. She had prior readings indicating higher cholesterol level at PCP office. She states she was unaware of these findings previously.  Please ensure she is taking Crestor 5 mg daily. Repeat lipid in 6 months to 1 year.   Bradycardia: Patient had bradycardia with EKG suggesting slightly prolonged PR interval and prolonged QRS interval. There may be underlying CAD as she has history of HLD she was unaware of. Echocardiogram is was normal. Patient currently asymptomatic. Continue to monitor.   2.  Labs / imaging needed at time of follow-up: None  3.  Pending labs/ test needing follow-up: None  Follow-up Appointments: PCP follow up and Neurology follow up  Hospital Course by problem list: 02/22/21: Patient is 55 year old female with no significant past medical history presenting with generalized numbness and weakness. The patient states she was well last time at 830 am today but then she got episodes of weakness, dizziness along with numbness in her body. No specific location was isolated. She stated it got worse as time went on as she tried to pray her way through some of the symptoms. She states the symptoms got worse where she fell once before EMS arrived and then when EMS told her to stand she fell again. She did not hit her head either times. She stated her symptoms have mostly resolved with residual tingling and numbness left. When asked if she was adequately hydrated she stated she has been been busy in the last 3 days and had visited 150 houses as part of her church. She stated she has been out in the sun a lot as well. She states she has been getting dizziness/lightheadedness with postural changes today. She endorsed blurry vision this past  week that comes and goes. Otherwise the patient endorsed being very healthy and denied other symptoms. CTA and MRI of brain did not reveal any acute findings. MRI did show her brain had atherosclerotic changes advanced for her age. Symptoms started to resolve while the patient was in the ED. 02/23/21: No acute events overnight. EKG was repeated that showed bradycardia and prolonged PR and QRS  interval. The patient was asymptomatic. Echo was performed showing the results below. Patient deemed stable for discharge. Instructions explained and and patient endorsed understanding. Patient was discharged.    Discharge Subjective:  Patient doing well. States she is feeling back at baseline. Wanted to go home but speak with neurology regarding her imaging findings. After contacting neurology they will see patient in outpatient setting.   Discharge Exam:   BP 101/78 (BP Location: Left Arm)   Pulse (!) 56   Temp 98 F (36.7 C) (Oral)   Resp 18   Ht 5\' 8"  (1.727 m)   Wt 90.6 kg   SpO2 100%   BMI 30.37 kg/m  Discharge Physical Exam General: Pleasant, NAD, comfortable CV: Slightly bradycardic, normal rhythm, 2+ pulses Lungs: CTAB Neuro: No neuro deficits CN II-XII intact Grip strength 5/5 Upper and lower extremity strength 5/5 Sensation bilaterally intact; no deficits  Pertinent Labs, Studies, and Procedures:  CBC Latest Ref Rng & Units 02/22/2021 02/22/2021 02/22/2021  WBC 4.0 - 10.5 K/uL 5.8 - 5.7  Hemoglobin 12.0 - 15.0 g/dL 10.9(L) 11.2(L) 11.1(L)  Hematocrit 36.0 - 46.0 % 33.4(L) 33.0(L) 33.7(L)  Platelets 150 - 400 K/uL 146(L) - 157    CMP Latest Ref Rng & Units 02/22/2021 02/22/2021 02/22/2021  Glucose 70 - 99 mg/dL - 95 02/24/2021)  BUN 6 - 20 mg/dL - 741(O) 17  Creatinine 0.44 - 1.00 mg/dL 87(O) 6.76(H) 2.09(O)  Sodium 135 - 145 mmol/L - 140 139  Potassium 3.5 - 5.1 mmol/L - 3.9 4.1  Chloride 98 - 111 mmol/L - 107 106  CO2 22 - 32 mmol/L - - 22  Calcium 8.9 - 10.3 mg/dL - - 9.5  Total Protein 6.5 - 8.1 g/dL - - 7.0  Total Bilirubin 0.3 - 1.2 mg/dL - - 1.1  Alkaline Phos 38 - 126 U/L - - 65  AST 15 - 41 U/L - - 30  ALT 0 - 44 U/L - - 15   Lipid Panel     Component Value Date/Time   CHOL 229 (H) 02/22/2021 1857   TRIG 36 02/22/2021 1857   HDL 74 02/22/2021 1857   CHOLHDL 3.1 02/22/2021 1857   VLDL 7 02/22/2021 1857   LDLCALC 148 (H) 02/22/2021 1857   A1c:  6.4 Iron/TIBC/Ferritin/ %Sat    Component Value Date/Time   IRON 68 02/22/2021 1857   TIBC 297 02/22/2021 1857   FERRITIN 76 02/22/2021 1857   IRONPCTSAT 23 02/22/2021 1857   TSH: 1.084  MR BRAIN WO CONTRAST Result Date: 02/22/2021 CLINICAL DATA:  Neuro deficit, acute, stroke suspected. Diffuse body numbness, imbalance, right leg weakness, and right hand muscle spasms. Blurred vision and dizziness.  EXAM: MRI HEAD WITHOUT CONTRAST  IMPRESSION: 1. No acute intracranial abnormality. 2. Moderately age advanced chronic small vessel ischemic disease. Electronically Signed   By: 02/24/2021 M.D.   On: 02/22/2021 12:33   CT HEAD CODE STROKE WO CONTRAST Result Date: 02/22/2021 CLINICAL DATA:  Code stroke.  55 year old female EXAM: CT HEAD WITHOUT CONTRAST  IMPRESSION: 1. Largely normal for age noncontrast CT appearance of the brain; minimal  white matter changes. ASPECTS 10. 2. These results were communicated to Dr. Iver Nestle at 10:44 am on 02/22/2021 by text page via the [facility] messaging system. Electronically Signed   By: Odessa Fleming M.D.   On: 02/22/2021 10:44   CT ANGIO HEAD NECK W WO CM (CODE STROKE) Result Date: 02/22/2021 CLINICAL DATA:  Code stroke. 55 year old female EXAM: CT ANGIOGRAPHY HEAD AND NECK  IMPRESSION: 1. Negative for large vessel occlusion. 2. Minimal atherosclerosis - primarily at the left ICA origin - with no arterial stenosis in the head or neck. These results were communicated to Dr. Iver Nestle at 11:05 am on 02/22/2021 by text page via the [facility] messaging system. Electronically Signed   By: Odessa Fleming M.D.   On: 02/22/2021 11:06     ECHOCARDIOGRAM COMPLETE  Result Date: 02/23/2021    ECHOCARDIOGRAM REPORT   Patient Name:   CHRISTE TELLEZ Date of Exam: 02/23/2021 Medical Rec #:  [id number]            Height:       68.0 in Accession #:    [id number]           Weight:       196.6 lb Date of Birth:  1966/03/16            BSA:          2.029 m Patient Age:    55 years             BP:            117/74 mmHg Patient Gender: F                    HR:           52 bpm. Exam Location:  Inpatient Procedure: 2D Echo, Color Doppler and Cardiac Doppler Indications:    TIA  History:        Patient has no prior history of Echocardiogram examinations.  Sonographer:    Eulah Pont RDCS Referring Phys: [id number] ERIK C HOFFMAN IMPRESSIONS  1. Left ventricular ejection fraction, by estimation, is 60 to 65%. The left ventricle has normal function. The left ventricle has no regional wall motion abnormalities. Left ventricular diastolic parameters were normal.  2. Right ventricular systolic function is normal. The right ventricular size is normal. Tricuspid regurgitation signal is inadequate for assessing PA pressure.  3. The mitral valve is normal in structure. Trivial mitral valve regurgitation. No evidence of mitral stenosis.  4. The aortic valve was not well visualized. Aortic valve regurgitation is not visualized. No aortic stenosis is present.  5. The inferior vena cava is normal in size with greater than 50% respiratory variability, suggesting right atrial pressure of 3 mmHg. Comparison(s): No prior Echocardiogram. Conclusion(s)/Recommendation(s): Normal biventricular function without evidence of hemodynamically significant valvular heart disease. No intracardiac source of embolism detected on this transthoracic study. A transesophageal echocardiogram is recommended to exclude cardiac source of embolism if clinically indicated. FINDINGS  Left Ventricle: Left ventricular ejection fraction, by estimation, is 60 to 65%. The left ventricle has normal function. The left ventricle has no regional wall motion abnormalities. The left ventricular internal cavity size was normal in size. There is  no left ventricular hypertrophy. Left ventricular diastolic parameters were normal. Right Ventricle: The right ventricular size is normal. No increase in right ventricular wall thickness. Right ventricular systolic function is  normal. Tricuspid regurgitation signal is inadequate for assessing PA pressure. Left Atrium: Left atrial size was normal in  size. Right Atrium: Right atrial size was normal in size. Pericardium: There is no evidence of pericardial effusion. Mitral Valve: The mitral valve is normal in structure. Trivial mitral valve regurgitation. No evidence of mitral valve stenosis. Tricuspid Valve: The tricuspid valve is normal in structure. Tricuspid valve regurgitation is not demonstrated. No evidence of tricuspid stenosis. Aortic Valve: The aortic valve was not well visualized. Aortic valve regurgitation is not visualized. No aortic stenosis is present. Pulmonic Valve: The pulmonic valve was not well visualized. Pulmonic valve regurgitation is not visualized. Aorta: The aortic root, ascending aorta, aortic arch and descending aorta are all structurally normal, with no evidence of dilitation or obstruction. Venous: The inferior vena cava is normal in size with greater than 50% respiratory variability, suggesting right atrial pressure of 3 mmHg. IAS/Shunts: No atrial level shunt detected by color flow Doppler.  LEFT VENTRICLE PLAX 2D LVIDd:         4.50 cm  Diastology LVIDs:         2.80 cm  LV e' medial:    8.27 cm/s LV PW:         0.70 cm  LV E/e' medial:  10.8 LV IVS:        0.60 cm  LV e' lateral:   11.40 cm/s LVOT diam:     1.90 cm  LV E/e' lateral: 7.8 LV SV:         79 LV SV Index:   39 LVOT Area:     2.84 cm  RIGHT VENTRICLE RV S prime:     13.10 cm/s TAPSE (M-mode): 2.0 cm LEFT ATRIUM             Index       RIGHT ATRIUM           Index LA diam:        2.50 cm 1.23 cm/m  RA Area:     15.30 cm LA Vol (A2C):   32.5 ml 16.02 ml/m RA Volume:   37.80 ml  18.63 ml/m LA Vol (A4C):   30.5 ml 15.03 ml/m LA Biplane Vol: 32.6 ml 16.07 ml/m  AORTIC VALVE LVOT Vmax:   118.00 cm/s LVOT Vmean:  74.800 cm/s LVOT VTI:    0.278 m  AORTA Ao Root diam: 2.70 cm Ao Asc diam:  2.90 cm MITRAL VALVE MV Area (PHT): 3.08 cm    SHUNTS MV  Decel Time: 246 msec    Systemic VTI:  0.28 m MV E velocity: 89.20 cm/s  Systemic Diam: 1.90 cm MV A velocity: 92.90 cm/s MV E/A ratio:  0.96 Jodelle RedBridgette Christopher MD Electronically signed by Jodelle RedBridgette Christopher MD Signature Date/Time: 02/23/2021/6:32:51 PM    Final      Discharge Instructions: Discharge Instructions     Ambulatory referral to Neurology   Complete by: As directed    An appointment is requested in approximately: 4-6 weeks       Signed: Merrilyn PumaJinwala, Sagar, MD 02/23/2021, 6:45 PM   Pager: [phone]

## 2021-02-23 NOTE — Plan of Care (Signed)
  Problem: Education: Goal: Knowledge of General Education information will improve Description: Including pain rating scale, medication(s)/side effects and non-pharmacologic comfort measures Outcome: Adequate for Discharge   Problem: Health Behavior/Discharge Planning: Goal: Ability to manage health-related needs will improve Outcome: Adequate for Discharge   Problem: Clinical Measurements: Goal: Ability to maintain clinical measurements within normal limits will improve Outcome: Adequate for Discharge Goal: Will remain free from infection Outcome: Adequate for Discharge Goal: Diagnostic test results will improve Outcome: Adequate for Discharge Goal: Respiratory complications will improve Outcome: Adequate for Discharge Goal: Cardiovascular complication will be avoided Outcome: Adequate for Discharge   Problem: Activity: Goal: Risk for activity intolerance will decrease Outcome: Adequate for Discharge   Problem: Nutrition: Goal: Adequate nutrition will be maintained Outcome: Adequate for Discharge   Problem: Coping: Goal: Level of anxiety will decrease Outcome: Adequate for Discharge   Problem: Elimination: Goal: Will not experience complications related to bowel motility Outcome: Adequate for Discharge Goal: Will not experience complications related to urinary retention Outcome: Adequate for Discharge   Problem: Pain Managment: Goal: General experience of comfort will improve Outcome: Adequate for Discharge   Problem: Safety: Goal: Ability to remain free from injury will improve Outcome: Adequate for Discharge   Problem: Skin Integrity: Goal: Risk for impaired skin integrity will decrease Outcome: Adequate for Discharge   Problem: Education: Goal: Knowledge of disease or condition will improve Outcome: Adequate for Discharge Goal: Knowledge of secondary prevention will improve Outcome: Adequate for Discharge Goal: Knowledge of patient specific risk factors  addressed and post discharge goals established will improve Outcome: Adequate for Discharge Goal: Individualized Educational Video(s) Outcome: Adequate for Discharge   Problem: Coping: Goal: Will verbalize positive feelings about self Outcome: Adequate for Discharge Goal: Will identify appropriate support needs Outcome: Adequate for Discharge   Problem: Health Behavior/Discharge Planning: Goal: Ability to manage health-related needs will improve Outcome: Adequate for Discharge   Problem: Self-Care: Goal: Ability to participate in self-care as condition permits will improve Outcome: Adequate for Discharge Goal: Verbalization of feelings and concerns over difficulty with self-care will improve Outcome: Adequate for Discharge Goal: Ability to communicate needs accurately will improve Outcome: Adequate for Discharge   Problem: Ischemic Stroke/TIA Tissue Perfusion: Goal: Complications of ischemic stroke/TIA will be minimized Outcome: Adequate for Discharge   

## 2021-02-23 NOTE — Evaluation (Signed)
Occupational Therapy Evaluation Patient Details Name: Mary Massey MRN: [id number] DOB: January 24, 1966 Today's Date: 02/23/2021    History of Present Illness Pt is a 55 yo female s/p parathesias  of BUEs and BLEs after working in hot sun and sitting down and dipping forwrd to carry heavy objects. CT head and MRI neagtive for acute ab. Diagnosis of extreme dehydration/heat exhaustion.   Clinical Impression   Pt PTA: Pt living with spouse, reports being very active and stressed. Pt currently, independent with mobility and OOB ADL. Pt demonstrating stair management x3 steps x2 times with no difficulty. Pt's vision has returned to normal as she is able to read texts and novel information without assist. Pt does not require continued OT skilled services. OT signing off. Thank you.    Follow Up Recommendations  No OT follow up    Equipment Recommendations  None recommended by OT    Recommendations for Other Services       Precautions / Restrictions Precautions Precautions: None Restrictions Weight Bearing Restrictions: No      Mobility Bed Mobility Overal bed mobility: Modified Independent                  Transfers Overall transfer level: Modified independent Equipment used: None                  Balance Overall balance assessment: Modified Independent                                         ADL either performed or assessed with clinical judgement   ADL Overall ADL's : Independent                                             Vision Baseline Vision/History: 1 Wears glasses Ability to See in Adequate Light: 0 Adequate Patient Visual Report: No change from baseline Vision Assessment?: Yes Eye Alignment: Within Functional Limits Ocular Range of Motion: Within Functional Limits Alignment/Gaze Preference: Within Defined Limits Tracking/Visual Pursuits: Able to track stimulus in all quads without difficulty      Perception     Praxis      Pertinent Vitals/Pain Pain Assessment: No/denies pain     Hand Dominance Right   Extremity/Trunk Assessment Upper Extremity Assessment Upper Extremity Assessment: LUE deficits/detail LUE Deficits / Details: parasthesias continued  in L forerarm LUE Sensation: WNL LUE Coordination: WNL   Lower Extremity Assessment Lower Extremity Assessment: Overall WFL for tasks assessed   Cervical / Trunk Assessment Cervical / Trunk Assessment: Normal   Communication Communication Communication: No difficulties   Cognition Arousal/Alertness: Awake/alert Behavior During Therapy: WFL for tasks assessed/performed Overall Cognitive Status: Within Functional Limits for tasks assessed                                     General Comments  Pt reading texts and pamphlet; able to process information.    Exercises     Shoulder Instructions      Home Living Family/patient expects to be discharged to:: Private residence Living Arrangements: Spouse/significant other Available Help at Discharge: Family;Available PRN/intermittently Type of Home: House Home Access: Stairs to enter Entrance Stairs-Number of Steps: 3 Entrance Stairs-Rails: Can reach  both Home Layout: Multi-level Alternate Level Stairs-Number of Steps: full fight   Bathroom Shower/Tub: Tub/shower unit;Walk-in shower   Bathroom Toilet: Handicapped height     Home Equipment: None          Prior Functioning/Environment Level of Independence: Independent        Comments: driving, working        OT Problem List: Decreased coordination      OT Treatment/Interventions:      OT Goals(Current goals can be found in the care plan section) Acute Rehab OT Goals OT Goal Formulation: All assessment and education complete, DC therapy  OT Frequency:     Barriers to D/C:            Co-evaluation              AM-PAC OT "6 Clicks" Daily Activity     Outcome Measure Help  from another person eating meals?: None Help from another person taking care of personal grooming?: None Help from another person toileting, which includes using toliet, bedpan, or urinal?: None Help from another person bathing (including washing, rinsing, drying)?: None Help from another person to put on and taking off regular upper body clothing?: None Help from another person to put on and taking off regular lower body clothing?: None 6 Click Score: 24   End of Session Nurse Communication: Mobility status  Activity Tolerance: Patient tolerated treatment well Patient left: Other (comment) (up with PT)  OT Visit Diagnosis: Unsteadiness on feet (R26.81)                Time: 8341-9622 OT Time Calculation (min): 17 min Charges:  OT General Charges $OT Visit: 1 Visit OT Evaluation $OT Eval Moderate Complexity: 1 Mod  Flora Lipps, OTR/L Acute Rehabilitation Services Pager: [phone] Office: [phone]   Alyric Parkin C 02/23/2021, 10:25 AM

## 2021-02-23 NOTE — Progress Notes (Addendum)
  Gilberte Deberry Laural Benes is a 55 y.o. with no significant PMH admit for sudden onset weakness and numbness on hospital day 1. Subjective:  Patient doing well. States she is feeling back at baseline. Wanted to go home but speak with neurology regarding her imaging findings. After contacting neurology they will see patient in outpatient setting.    Overnight:No overnight events.   Objective:  Vital signs in last 24 hours: Vitals:   02/22/21 2320 02/23/21 0041 02/23/21 0241 02/23/21 0348  BP: 99/60 98/62 101/62 (!) 102/58  Pulse: (!) 52 60 (!) 55 (!) 44  Resp: 16 18 18 16   Temp: 98.3 F (36.8 C) 98.2 F (36.8 C) 97.9 F (36.6 C) 97.6 F (36.4 C)  TempSrc: Oral Oral Oral Oral  SpO2: 99% 100% 99% 100%  Weight:       Physical Exam General: Pleasant, NAD, comfortable CV: Slightly bradycardic, normal rhythm, 2+ pulses Lungs: CTAB Neuro: No neuro deficits CN II-XII intact Grip strength 5/5 Upper and lower extremity strength 5/5 Sensation bilaterally intact; no deficits Assessment/Plan:  Active Problems:   TIA (transient ischemic attack)  TIA vs heat exhaustion  Patient has symptoms inconsistent with ischemic stroke as they were generalized but had risk factors present for stroke such as obesity, hyperlipidemia, and prediabetes. Imaging findings were negative for stroke but showed atherosclerotic changes in the brain. History and physical was consistent with symptoms due to heat exhaustion but cannot ignore risk factors for stroke.   Plan: -Aspirin 81 mg -Crestor 5 mg -Neuro outpatient follow up  Anxiety Patient has anxiety at baseline that needs to be evaluated with outpatient screening tool.  HLD Patient had lipid panel performed at showing total cholesterol of 229 and LDL of 148. She had prior readings indicating higher cholesterol level at PCP office. She states she was unaware of these findings previously.   Plan: -Aspirin 81 mg -Crestor 5 mg  -PCP and Neuro follow  up  Bradycardia: Patient had bradycardia with EKG suggesting slightly prolonged PR interval and prolonged QRS interval. Echocardiogram is pending. Patient currently asymptomatic.   Plan: -Continue to monitor -Follow up on echo  DVT prophx: Ambulation Diet: Cardiac Bowel: Prn Code: DNR  Prior to Admission Living Arrangement: Home Anticipated Discharge Location: Home Barriers to Discharge: Home Dispo: Anticipated discharge in approximately 1 day(s).   , MD 02/23/2021, 6:58 AM Pager: [phone] After 5pm on weekdays and 1pm on weekends:

## 2021-02-23 NOTE — Evaluation (Signed)
Physical Therapy Evaluation Patient Details Name: Mary Massey MRN: [id number] DOB: May 23, 1966 Today's Date: 02/23/2021   History of Present Illness  Pt is a 55 yo female s/p parathesias  of BUEs and BLEs after working in hot sun and sitting down and dipping forwrd to carry heavy objects. CT head and MRI neagtive for acute ab. Diagnosis of extreme dehydration/heat exhaustion.  Clinical Impression  PTA pt living in multistory home with 3 steps to enter with husband. Pt reports complete independence working, volunteering and taking care of sibling with CA. Pt reports increased prolonged stress, and is taking a less stressful job to start tomorrow. Pt is mod I in all mobility and has no further PT or equipment needs. Educated pt on BE FAST and need to reduce stress in her life. Pt verbalizes understanding. Looking forward to discharge this afternoon.     Follow Up Recommendations No PT follow up    Equipment Recommendations  None recommended by PT    Recommendations for Other Services       Precautions / Restrictions Precautions Precautions: None Restrictions Weight Bearing Restrictions: No      Mobility  Bed Mobility Overal bed mobility: Modified Independent                  Transfers Overall transfer level: Modified independent Equipment used: None                Ambulation/Gait Ambulation/Gait assistance: Modified independent (Device/Increase time) Gait Distance (Feet): 50 Feet Assistive device: None Gait Pattern/deviations: WFL(Within Functional Limits) Gait velocity: WFL Gait velocity interpretation: >2.62 ft/sec, indicative of community ambulatory General Gait Details: strong steady gait no noted deficits  Stairs Stairs: Yes       General stair comments: OT reports strong steady ascent/descent  Wheelchair Mobility    Modified Rankin (Stroke Patients Only)       Balance Overall balance assessment: Modified Independent                                            Pertinent Vitals/Pain Pain Assessment: No/denies pain    Home Living Family/patient expects to be discharged to:: Private residence Living Arrangements: Spouse/significant other Available Help at Discharge: Family;Available PRN/intermittently Type of Home: House Home Access: Stairs to enter Entrance Stairs-Rails: Can reach both Entrance Stairs-Number of Steps: 3 Home Layout: Multi-level Home Equipment: None      Prior Function Level of Independence: Independent         Comments: driving, working     Hand Dominance   Dominant Hand: Right    Extremity/Trunk Assessment   Upper Extremity Assessment Upper Extremity Assessment: LUE deficits/detail LUE Deficits / Details: parasthesias continued  in L forerarm LUE Sensation: WNL LUE Coordination: WNL    Lower Extremity Assessment Lower Extremity Assessment: RLE deficits/detail;LLE deficits/detail RLE Deficits / Details: ROM WFL/strength 5/5 RLE Sensation: WNL RLE Coordination: WNL LLE Deficits / Details: ROM WFL/strength 5/5 LLE Sensation: WNL LLE Coordination: WNL    Cervical / Trunk Assessment Cervical / Trunk Assessment: Normal  Communication   Communication: No difficulties  Cognition Arousal/Alertness: Awake/alert Behavior During Therapy: WFL for tasks assessed/performed Overall Cognitive Status: Within Functional Limits for tasks assessed  General Comments General comments (skin integrity, edema, etc.): VSS on RA, provided reminder of BE FAST symptomology        Assessment/Plan    PT Assessment Patent does not need any further PT services         PT Goals (Current goals can be found in the Care Plan section)  Acute Rehab PT Goals Patient Stated Goal: reduce stress in her life PT Goal Formulation: All assessment and education complete, DC therapy     AM-PAC PT "6 Clicks" Mobility  Outcome  Measure Help needed turning from your back to your side while in a flat bed without using bedrails?: None Help needed moving from lying on your back to sitting on the side of a flat bed without using bedrails?: None Help needed moving to and from a bed to a chair (including a wheelchair)?: None Help needed standing up from a chair using your arms (e.g., wheelchair or bedside chair)?: None Help needed to walk in hospital room?: None Help needed climbing 3-5 steps with a railing? : None 6 Click Score: 24    End of Session   Activity Tolerance: Patient tolerated treatment well Patient left: in bed;with call bell/phone within reach;Other (comment) (MD in room) Nurse Communication: Mobility status PT Visit Diagnosis: Other symptoms and signs involving the nervous system (T62.563)    Time: 8937-3428 PT Time Calculation (min) (ACUTE ONLY): 12 min   Charges:   PT Evaluation $PT Eval Moderate Complexity: 1 Mod          Khup Sapia B. Beverely Risen PT, DPT Acute Rehabilitation Services Pager [phone] Office [phone]   Elon Alas Fleet 02/23/2021, 11:12 AM

## 2021-04-03 ENCOUNTER — Ambulatory Visit: Admitting: Neurology

## 2021-04-03 ENCOUNTER — Encounter: Payer: Self-pay | Admitting: Neurology

## 2022-07-27 ENCOUNTER — Encounter: Payer: Self-pay | Admitting: Licensed Clinical Social Worker

## 2022-07-27 ENCOUNTER — Inpatient Hospital Stay: Admitting: Licensed Clinical Social Worker

## 2022-07-27 ENCOUNTER — Inpatient Hospital Stay

## 2022-07-27 ENCOUNTER — Other Ambulatory Visit: Payer: Self-pay

## 2022-07-27 ENCOUNTER — Other Ambulatory Visit: Payer: Self-pay | Admitting: Licensed Clinical Social Worker

## 2022-07-27 DIAGNOSIS — Z800 Family history of malignant neoplasm of digestive organs: Secondary | ICD-10-CM

## 2022-07-27 NOTE — Progress Notes (Signed)
REFERRING PROVIDER: Harlan Stains, MD [address] [place],  [place] [address]  PRIMARY PROVIDER:  Patient, No Pcp Per  PRIMARY REASON FOR VISIT:  1. Family history of pancreatic cancer   2. Family history of esophageal cancer   3. Family history of rectal cancer      HISTORY OF PRESENT ILLNESS:   [name], a 57 y.o. female, was seen for a [facility] cancer genetics consultation at the request of Dr. Dema Severin due to a family history of cancer.  [name] presents to clinic today to discuss the possibility of a hereditary predisposition to cancer, genetic testing, and to further clarify her future cancer risks, as well as potential cancer risks for family members.   CANCER HISTORY:  [name] is a 57 y.o. female with no personal history of cancer.    RISK FACTORS:  Menarche was at age 64.  OCP use for approximately 0 years.  Ovaries intact: yes.  Hysterectomy: partial.  Menopausal status: postmenopausal.  HRT use: 0 years. Colonoscopy: yes; normal.- has them every 5 years due to mother's rectal cancer. Mammogram within the last year: yes. Number of breast biopsies: 1. Up to date with pelvic exams: yes.   Past Medical History:  Diagnosis Date   Anxiety     Past Surgical History:  Procedure Laterality Date   ABDOMINAL HYSTERECTOMY      FAMILY HISTORY:  We obtained a detailed, 4-generation family history.  Significant diagnoses are listed below: Family History  Problem Relation Age of Onset   Rectal cancer Mother 20   Pancreatic cancer Sister 47   Diabetes Sister    Esophageal cancer Brother 52   Diabetes Paternal Grandfather    [name] had 3 sisters and 1 brother. One of her sisters had pancreatic cancer at 18 and passed at 55. Her brother had esophageal cancer at 31 and passed at 98.  [name]'s mother had rectal cancer at 42 and passed from it at 6. No other known cancers on this side of the  family.  [name]'s father died at 16 in an accident. No known cancers on his side of the family.  [name] is unaware of previous family history of genetic testing for hereditary cancer risks. There is no reported Ashkenazi Jewish ancestry. There is no known consanguinity.    GENETIC COUNSELING ASSESSMENT: [name] is a 57 y.o. female with a family history of pancreatic and rectal which is somewhat suggestive of a hereditary cancer syndrome and predisposition to cancer. We, therefore, discussed and recommended the following at today's visit.   DISCUSSION: We discussed that approximately 10% of cancer is hereditary. Lynch syndrome can increase the risk for pancreatic and rectal cancer, although there are other genes associated with hereditary cancer as well. Cancers and risks are gene specific. We discussed that testing is beneficial for several reasons including knowing about cancer risks, identifying potential screening and risk-reduction options that may be appropriate, and to understand if other family members could be at risk for cancer and allow them to undergo genetic testing.   We reviewed the characteristics, features and inheritance patterns of hereditary cancer syndromes. We also discussed genetic testing, including the appropriate family members to test, the process of testing, insurance coverage and turn-around-time for results. We discussed the implications of a negative, positive and/or variant of uncertain significant result. We recommended [name] pursue genetic testing for the [facility] Multi-Cancer+RNA gene panel.  Based on [name]'s family history of cancer, she meets medical criteria for genetic testing. Despite that she meets criteria, she may still have an out of pocket cost. We discussed that if her out of pocket cost for testing is over $100, the laboratory will call and confirm whether she wants to proceed with testing.   If the out of pocket cost of testing is less than $100 she will be billed by the genetic testing laboratory.   PLAN: After considering the risks, benefits, and limitations, [name] provided informed consent to pursue genetic testing and the blood sample was sent to [facility] for analysis of the Multi-Cancer+RNA panel. Results should be available within approximately 2-3 weeks' time, at which point they will be disclosed by telephone to [name], as will any additional recommendations warranted by these results. [name] will receive a summary of her genetic counseling visit and a copy of her results once available. This information will also be available in Epic.   [name]'s questions were answered to her satisfaction today. Our contact information was provided should additional questions or concerns arise. Thank you for the referral and allowing [place] to share in the care of your patient.   Faith Rogue, MS, [facility] Genetic Counselor [email or web address] Phone: [phone]  The patient was seen for a total of 35 minutes in face-to-face genetic counseling.  Dr. Grayland Ormond was available for discussion regarding this case.   _______________________________________________________________________ For Office Staff:  Number of people involved in session: 1 Was an Intern/ student involved with case: no

## 2022-07-28 LAB — GENETIC SCREENING ORDER

## 2022-08-04 ENCOUNTER — Telehealth: Payer: Self-pay | Admitting: Licensed Clinical Social Worker

## 2022-08-04 ENCOUNTER — Ambulatory Visit (INDEPENDENT_AMBULATORY_CARE_PROVIDER_SITE_OTHER): Admitting: Sports Medicine

## 2022-08-04 ENCOUNTER — Ambulatory Visit
Admission: RE | Admit: 2022-08-04 | Discharge: 2022-08-04 | Disposition: A | Discharge status: Home / Self Care | Source: Ambulatory Visit | Attending: Sports Medicine | Admitting: Sports Medicine

## 2022-08-04 VITALS — BP 104/70 | Ht 68.0 in | Wt 189.2 lb

## 2022-08-04 DIAGNOSIS — G8929 Other chronic pain: Secondary | ICD-10-CM | POA: Diagnosis not present

## 2022-08-04 DIAGNOSIS — M7711 Lateral epicondylitis, right elbow: Secondary | ICD-10-CM

## 2022-08-04 DIAGNOSIS — M25511 Pain in right shoulder: Secondary | ICD-10-CM | POA: Diagnosis not present

## 2022-08-05 NOTE — Progress Notes (Signed)
   Subjective:    Patient ID: Mary Massey, female    DOB: 1965/11/16, 57 y.o.   MRN: [id number]  HPI chief complaint: Right shoulder and right elbow pain  Patient is a very pleasant 57 year old female that presents today complaining of right shoulder and right elbow pain that began in September after moving a heavy table.  She experienced pain in both the shoulder and the elbow at that time.  Symptoms were initially tolerable but have now become quite severe.  She has noticed decreased range of motion in the right shoulder in addition to pain.  Pain is diffuse along the lateral shoulder.  Worse with an abducted and externally rotated position such as reaching in her backseat.  Her right elbow pain is along the lateral elbow and radiates into the forearm.  She was placed on naproxen but it was not beneficial.  She found some online exercises for her elbow pain and did find them to be helpful although the instructions were for her to do the exercises hourly which she was unable to do.  She has also found some benefit from using frankincense as well as Voltaren gel.  She denies any problems with the elbow or shoulder in the past.  No prior surgeries to either area.  Past medical history reviewed Medications reviewed Allergies reviewed    Review of Systems As above    Objective:   Physical Exam  Well-developed, well-nourished.  No acute distress  Right shoulder: No obvious deformity.  There is some tenderness to palpation diffusely throughout the shoulder.  She has limited active and passive range of motion with abduction and external rotation.  Internal rotation is limited by pain.  She has nearly full forward flexion actively.  There is some weakness with rotator cuff stressing.  Right elbow: Good range of motion.  No effusion.  She is tender to palpation over the lateral epicondyle and has reproducible pain with ECRB testing.  No tenderness over the medial epicondyle.  Good pulses  distally.  X-rays of the right shoulder show some mild degenerative changes at both the [facility] joint and the glenohumeral joint.  Nothing acute.      Assessment & Plan:   Right shoulder pain secondary to adhesive capsulitis-rule out rotator cuff tear Posttraumatic right elbow lateral epicondylitis  There is concern that her adhesive capsulitis may be secondary to a rotator cuff tear that she suffered at the time of moving that heavy table back in the fall.  She does have weakness on exam today.  I would like to order an MRI specifically to rule out a rotator cuff tear.  Phone follow-up with those results when available.  In the meantime, she will start a home exercise program consisting of range of motion exercises for the shoulder as well as comprehensive exercises for her lateral epicondylitis.  We also discussed using a counterforce brace.  She has purchased one but it sounds like she is wearing it incorrectly.  I showed her the correct position of the brace.  We also discussed the addition of gabapentin but decided against it for now.  She will continue with her topical frankincense and Voltaren gel since they do seem to be beneficial.  This note was dictated using Dragon naturally speaking software and may contain errors in syntax, spelling, or content which have not been identified prior to signing this note.

## 2022-08-11 NOTE — Telephone Encounter (Signed)
I contacted [name] to discuss her genetic testing results. No pathogenic variants were identified in the 70 genes analyzed. Detailed clinic note to follow.   The test report has been scanned into EPIC and is located under the Molecular Pathology section of the Results Review tab.  A portion of the result report is included below for reference.      Faith Rogue, MS, [facility] Genetic Counselor [email or web address] Phone: [phone]

## 2022-08-13 ENCOUNTER — Encounter: Payer: Self-pay | Admitting: Licensed Clinical Social Worker

## 2022-08-13 ENCOUNTER — Ambulatory Visit: Payer: Self-pay | Admitting: Licensed Clinical Social Worker

## 2022-08-13 DIAGNOSIS — Z1379 Encounter for other screening for genetic and chromosomal anomalies: Secondary | ICD-10-CM | POA: Insufficient documentation

## 2022-08-13 NOTE — Progress Notes (Signed)
HPI:   Mary Massey was previously seen in the [facility] clinic due to a family history of cancer and concerns regarding a hereditary predisposition to cancer. Please refer to our prior cancer genetics clinic note for more information regarding our discussion, assessment and recommendations, at the time. Mary Massey's recent genetic test results were disclosed to her, as were recommendations warranted by these results. These results and recommendations are discussed in more detail below.  CANCER HISTORY:  Oncology History   No history exists.    FAMILY HISTORY:  We obtained a detailed, 4-generation family history.  Significant diagnoses are listed below: Family History  Problem Relation Age of Onset   Rectal cancer Mother 6   Pancreatic cancer Sister 72   Diabetes Sister    Esophageal cancer Brother 59   Diabetes Paternal Grandfather     [name] had 3 sisters and 1 brother. One of her sisters had pancreatic cancer at 59 and passed at 59. Her brother had esophageal cancer at 59 and passed at 59.   Mary Massey's mother had rectal cancer at 59 and passed from it at 59. No other known cancers on this side of the family.   Mary Massey's father died at 96 in an accident. No known cancers on his side of the family.   [name] is unaware of previous family history of genetic testing for hereditary cancer risks. There is no reported Ashkenazi Jewish ancestry. There is no known consanguinity.     GENETIC TEST RESULTS:  The [facility] Multi-Cancer+RNA Panel found no pathogenic mutations.   The Multi-Cancer + RNA Panel offered by [facility] includes sequencing and/or deletion/duplication analysis of the following 70 genes:  AIP*, ALK, APC*, ATM*, AXIN2*, BAP1*, BARD1*, BLM*, BMPR1A*, BRCA1*, BRCA2*, BRIP1*, CDC73*, CDH1*, CDK4, CDKN1B*, CDKN2A, CHEK2*, CTNNA1*, DICER1*, EPCAM, EGFR, FH*, FLCN*, GREM1, HOXB13, KIT, LZTR1, MAX*,  MBD4, MEN1*, MET, MITF, MLH1*, MSH2*, MSH3*, MSH6*, MUTYH*, NF1*, NF2*, NTHL1*, PALB2*, PDGFRA, PMS2*, POLD1*, POLE*, POT1*, PRKAR1A*, PTCH1*, PTEN*, RAD51C*, RAD51D*, RB1*, RET, SDHA*, SDHAF2*, SDHB*, SDHC*, SDHD*, SMAD4*, SMARCA4*, SMARCB1*, SMARCE1*, STK11*, SUFU*, TMEM127*, TP53*, TSC1*, TSC2*, VHL*. RNA analysis is performed for * genes.   The test report has been scanned into EPIC and is located under the Molecular Pathology section of the Results Review tab.  A portion of the result report is included below for reference. Genetic testing reported out on 08/04/2022.      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  At this time, it is unknown if this variant is associated with an increased risk for cancer or if it is benign, but most uncertain variants are reclassified to benign. It should not be used to make medical management decisions. With time, we suspect the laboratory will determine the significance of this variant, if any. If the laboratory reclassifies this variant, we will attempt to contact [name] to discuss it further.   Even though a pathogenic variant was not identified, possible explanations for the cancer in the family may include: There may be no hereditary risk for cancer in the family. The cancers in [name] and/or her family may be sporadic/familial or due to other genetic and environmental factors. There may be a gene mutation in one of these genes that current testing methods cannot detect but that chance is small. There could be another gene that has not yet been discovered, or that we have  not yet tested, that is responsible for the cancer diagnoses in the family.  It is also possible there is a hereditary cause for the cancer in the family that Mary Massey did not inherit.  Therefore, it is important to remain in touch with cancer genetics in the future so that we can  continue to offer [name] the most up to date genetic testing.   ADDITIONAL GENETIC TESTING:  We discussed with [name] that her genetic testing was fairly extensive.  If there are additional relevant genes identified to increase cancer risk that can be analyzed in the future, we would be happy to discuss and coordinate this testing at that time.    CANCER SCREENING RECOMMENDATIONS:  Mary Massey's test result is considered negative (normal).  This means that we have not identified a hereditary cause for her family history of cancer at this time.   An individual's cancer risk and medical management are not determined by genetic test results alone. Overall cancer risk assessment incorporates additional factors, including personal medical history, family history, and any available genetic information that may result in a personalized plan for cancer prevention and surveillance. Therefore, it is recommended she continue to follow the cancer management and screening guidelines provided by her  primary healthcare provider.  Based on the reported personal and family history, specific cancer screenings for Ms. Velta Mary Massey and her family include:  Colon Cancer Screening: Due to [name]''s family's history of colon cancer, she is recommended to repeat colonoscopies at least every 5 years. More frequent colonoscopies may be recommended if polyps are identified.  RECOMMENDATIONS FOR FAMILY MEMBERS:   Since she did not inherit a identifiable mutation in a cancer predisposition gene included on this panel, her children could not have inherited a known mutation from her in one of these genes. Individuals in this family might be at some increased risk of developing cancer, over the general population risk, due to the family history of cancer.  Individuals in the family should notify their providers of the family history of cancer. We recommend women in this  family have a yearly mammogram beginning at age 65, or 60 years younger than the earliest onset of cancer, an annual clinical breast exam, and perform monthly breast self-exams.  Family members should have colonoscopies by at age 43, or earlier, as recommended by their providers. Other members of the family may still carry a pathogenic variant in one of these genes that Mary Massey did not inherit. Based on the family history, we recommend her other siblings/children of her sister who had pancreatic cancer have genetic counseling and testing. Ms. Luanne Jernberg will let us know if we can be of any assistance in coordinating genetic counseling and/or testing for this family member.   We do not recommend familial testing for the POLE variant of uncertain significance (VUS).  FOLLOW-UP:  Lastly, we discussed with [name] that cancer genetics is a rapidly advancing field and it is possible that new genetic tests will be appropriate for her and/or her family members in the future. We encouraged her to remain in contact with cancer genetics on an annual basis so we can update her personal and family histories and let her know of advances in cancer genetics that may benefit this family.   Our contact number was provided. Mary Massey's questions were answered to her satisfaction, and she knows she is welcome to call us at anytime with  additional questions or concerns.    Faith Rogue, MS, [facility] Genetic Counselor [email or web address] Phone: [phone]

## 2022-08-24 ENCOUNTER — Ambulatory Visit
Admission: RE | Admit: 2022-08-24 | Discharge: 2022-08-24 | Disposition: A | Discharge status: Home / Self Care | Source: Ambulatory Visit | Attending: Sports Medicine | Admitting: Sports Medicine

## 2022-08-24 DIAGNOSIS — G8929 Other chronic pain: Secondary | ICD-10-CM

## 2023-09-09 ENCOUNTER — Encounter: Payer: Self-pay | Admitting: Gastroenterology

## 2023-11-09 ENCOUNTER — Encounter: Payer: Self-pay | Admitting: Gastroenterology

## 2023-11-09 ENCOUNTER — Ambulatory Visit (INDEPENDENT_AMBULATORY_CARE_PROVIDER_SITE_OTHER): Admitting: Gastroenterology

## 2023-11-09 VITALS — BP 104/72 | HR 70 | Ht 68.0 in | Wt 188.0 lb

## 2023-11-09 DIAGNOSIS — Z800 Family history of malignant neoplasm of digestive organs: Secondary | ICD-10-CM

## 2023-11-09 DIAGNOSIS — Z01818 Encounter for other preprocedural examination: Secondary | ICD-10-CM

## 2023-11-09 NOTE — Progress Notes (Signed)
 Discussed the use of AI scribe software for clinical note transcription with the patient, who gave verbal consent to proceed.  HPI : Mary Massey "Mary Massey" is a 58 year old female who presents for a gastroenterology consultation regarding colonoscopy scheduling.  She has a significant family history of rectal cancer, with her mother diagnosed at age 81 and passing at age 39. Her family history also includes a sister with pancreatic cancer and a brother with esophageal cancer. Genetic testing has been performed, revealing no mutations.  She has undergone three colonoscopies, the last of which was six years ago in [place], [place] , with [facility]. No history of polyps has been reported.  No current gastrointestinal symptoms such as abdominal pain, diarrhea, bloody stool, unintentional weight loss, nausea, vomiting, or changes in appetite. She mentions having a cold but no chronic gastrointestinal issues.  She has a history of a narrow esophagus, which has been dilated in the past, providing relief. She attributes the narrowing to the size of her esophagus rather than acid reflux, as she has never been diagnosed with acid reflux.  She moved to her current location five years ago due to her husband's job as a Education officer, environmental.      Past Medical History:  Diagnosis Date   Anxiety      Past Surgical History:  Procedure Laterality Date   ABDOMINAL HYSTERECTOMY     Family History  Problem Relation Age of Onset   Rectal cancer Mother 2   Pancreatic cancer Sister 25   Diabetes Sister    Esophageal cancer Brother 23   Diabetes Paternal Grandfather    Social History   Tobacco Use   Smoking status: Never   Smokeless tobacco: Never  Vaping Use   Vaping status: Never Used  Substance Use Topics   Alcohol use: Never   Drug use: Never   Current Outpatient Medications  Medication Sig Dispense Refill   aspirin  81 MG chewable tablet Chew 1 tablet (81 mg total) by mouth daily. 90  tablet 0   diclofenac Sodium (VOLTAREN) 1 % GEL Apply 2 g topically daily.     rosuvastatin  (CRESTOR ) 5 MG tablet Take 1 tablet (5 mg total) by mouth daily. 90 tablet 0   No current facility-administered medications for this visit.   Allergies  Allergen Reactions   Azithromycin Rash     Review of Systems: All systems reviewed and negative except where noted in HPI.    No results found.  Physical Exam: BP 104/72   Pulse 70   Ht 5\' 8"  (1.727 m)   Wt 188 lb (85.3 kg)   SpO2 100%   BMI 28.59 kg/m  Constitutional: Pleasant,well-developed, African American female in no acute distress. HEENT: Normocephalic and atraumatic. Conjunctivae are normal. No scleral icterus. Cardiovascular: Normal rate, regular rhythm.  Pulmonary/chest: Effort normal and breath sounds normal. No wheezing, rales or rhonchi. Abdominal: Soft, nondistended, nontender. Bowel sounds active throughout. There are no masses palpable. No hepatomegaly. Extremities: no edema Neurological: Alert and oriented to person place and time. Skin: Skin is warm and dry. No rashes noted. Psychiatric: Normal mood and affect. Behavior is normal.  CBC    Component Value Date/Time   WBC 5.8 02/22/2021 1857   RBC 3.79 (L) 02/22/2021 1857   HGB 10.9 (L) 02/22/2021 1857   HCT 33.4 (L) 02/22/2021 1857   PLT 146 (L) 02/22/2021 1857   MCV 88.1 02/22/2021 1857   MCH 28.8 02/22/2021 1857   MCHC 32.6 02/22/2021 1857  RDW 14.2 02/22/2021 1857   LYMPHSABS 1.9 02/22/2021 1030   MONOABS 0.3 02/22/2021 1030   EOSABS 0.3 02/22/2021 1030   BASOSABS 0.0 02/22/2021 1030    CMP     Component Value Date/Time   NA 140 02/22/2021 1044   K 3.9 02/22/2021 1044   CL 107 02/22/2021 1044   CO2 22 02/22/2021 1030   GLUCOSE 95 02/22/2021 1044   BUN 21 (H) 02/22/2021 1044   CREATININE 1.02 (H) 02/22/2021 1857   CALCIUM  9.5 02/22/2021 1030   PROT 7.0 02/22/2021 1030   ALBUMIN 3.8 02/22/2021 1030   AST 30 02/22/2021 1030   ALT 15  02/22/2021 1030   ALKPHOS 65 02/22/2021 1030   BILITOT 1.1 02/22/2021 1030   GFRNONAA >60 02/22/2021 1857   GFRAA >60 05/02/2019 1535       Latest Ref Rng & Units 02/22/2021    6:57 PM 02/22/2021   10:44 AM 02/22/2021   10:30 AM  CBC EXTENDED  WBC 4.0 - 10.5 K/uL 5.8   5.7   RBC 3.87 - 5.11 MIL/uL 3.79   3.85   Hemoglobin 12.0 - 15.0 g/dL 16.1  09.6  04.5   HCT 36.0 - 46.0 % 33.4  33.0  33.7   Platelets 150 - 400 K/uL 146   157   NEUT# 1.7 - 7.7 K/uL   3.2   Lymph# 0.7 - 4.0 K/uL   1.9       ASSESSMENT AND PLAN:  58 year old female with family history of rectal cancer (mother, 70) with history of 3 previous normal colonoscopies, most recently 6 years ago.  She had been recommended repeat colonoscopy in 5 years.  Family history of colon cancer/colonoscopy screening Family history of rectal cancer in her mother, diagnosed at age 48. She has had three normal colonoscopies, the last one six years ago.  Although she had been recommended to undergo colonoscopy every 5 years, we discussed the current guidelines which suggest that for patients with 1 first-degree relative diagnosed with colorectal cancer after the age of 12, to undergo early screening starting age 41, and then 10 years if they themselves do not have polyps. We discussed that these are just guidelines, and that if the patient wanted to proceed with a colonoscopy, however be happy to perform it, but if she wanted to adhere by the guidelines, she would be a good for 10 years from her last colonoscopy.  The patient stated that she would like to discuss this further with her primary care provider.  If the patient does want to proceed with a colonoscopy, we can schedule this via telephone.  She has already undergone multiple colonoscopies and is familiar with the process/procedure. - Discuss with primary care physician regarding colonoscopy timing based on guidelines - Schedule colonoscopy if she decides to proceed  Narrow  esophagus Patient tells me she was told she had a narrow esophagus due to anatomical size, previously dilated with beneficial results. Not related to acid reflux. She is not currently interested in another dilation. - Monitor symptoms related to esophageal narrowing - Discuss potential need for future esophageal dilation if symptoms worsen  Tarun Patchell E. Cherryl Corona, MD  Gastroenterology        Victorio Grave, MD

## 2023-11-09 NOTE — Patient Instructions (Signed)
 Please call us  if you decide to schedule a colonoscopy.  _______________________________________________________  If your blood pressure at your visit was 140/90 or greater, please contact your primary care physician to follow up on this.  _______________________________________________________  If you are age 60 or older, your body mass index should be between 23-30. Your Body mass index is 28.59 kg/m. If this is out of the aforementioned range listed, please consider follow up with your Primary Care Provider.  If you are age 54 or younger, your body mass index should be between 19-25. Your Body mass index is 28.59 kg/m. If this is out of the aformentioned range listed, please consider follow up with your Primary Care Provider.   ________________________________________________________  The [facility] providers would like to encourage you to use MYCHART to communicate with providers for non-urgent requests or questions.  Due to long hold times on the telephone, sending your provider a message by [facility] may be a faster and more efficient way to get a response.  Please allow 48 business hours for a response.  Please remember that this is for non-urgent requests.  _______________________________________________________

## 2024-02-11 ENCOUNTER — Ambulatory Visit (INDEPENDENT_AMBULATORY_CARE_PROVIDER_SITE_OTHER): Payer: Self-pay

## 2024-02-11 ENCOUNTER — Ambulatory Visit (INDEPENDENT_AMBULATORY_CARE_PROVIDER_SITE_OTHER): Admitting: Podiatry

## 2024-02-11 ENCOUNTER — Encounter: Payer: Self-pay | Admitting: Podiatry

## 2024-02-11 DIAGNOSIS — M795 Residual foreign body in soft tissue: Secondary | ICD-10-CM

## 2024-02-11 NOTE — Patient Instructions (Signed)

## 2024-02-15 NOTE — Progress Notes (Signed)
 Subjective:  Patient ID: Mary Massey, female    DOB: 02-16-1966,  MRN: [id number]  Chief Complaint  Patient presents with   foreign body    Glassin right foot plantar x 2 weeks. Epsom salt soaks help to bring glass to the surface. 0 pain. Sharp at times.    Discussed the use of AI scribe software for clinical note transcription with the patient, who gave verbal consent to proceed.  History of Present Illness Mary Massey is a 58 year old female who presents with retained glass fragments in her foot. She was referred by the infirm[facility] for further evaluation and removal of glass fragments.  Two weeks ago, she stepped on a piece of glass at home and initially removed some fragments herself. She has been soaking her foot in Epsom salt to encourage any remaining glass to surface. On Wednesday evening, she experienced pain while walking up the stairs and noticed a white area on her foot where she could feel the glass. She extracted a piece with tweezers but continued to experience discomfort. On Thursday, a colleague removed a tiny piece of glass, and she visited the infirmary, where no additional fragments were removed. She continues to experience soreness, particularly after squeezing and attempting to tweeze out the glass. She applies petroleum jelly to the area, which causes bandages to slip off.      Objective:    Physical Exam General: AAO x3, NAD  Dermatological: Aspect the right foot is a hyperkeratotic lesion.  Upon debridement of this there is 1 small foreign body, glass identified.  Able to debride the callus that was present as well as a small amount.  After debrided this there was a punctate annular wound present consistent with a puncture wound, able to visualize or palpate any further foreign body.  There is no drainage or pus.  No fluctuation or crepitation.  Vascular: Dorsalis Pedis artery and Posterior Tibial artery pedal pulses are 2/4 bilateral with  immedate capillary fill time. Pedal hair growth present. No varicosities and no lower extremity edema present bilateral. There is no pain with calf compression, swelling, warmth, erythema.   Neruologic: Grossly intact via light touch bilateral.  Musculoskeletal: Prior to debridement there was tenderness palpation on the skin lesion, foreign body.  Gait: Unassisted, Nonantalgic.     No images are attached to the encounter.    Results RADIOLOGY Foot X-ray: No foreign body detected; no evidence of acute fracture.    Assessment:   1. Residual foreign body in soft tissue      Plan:  Patient was evaluated and treated and all questions answered.  Assessment and Plan Assessment & Plan Retained foreign body and open wound of right foot Retained glass fragments caused an open wound with redness and inflammation. X-ray negative for foreign body, likely due to small or non-radio-opaque glass pieces. Wound raw post-removal of some glass. - Today September the hyperkeratotic lesion and I was able to palpate a small residual foreign body which was removed today.  After debrided this there was a small open wound present but no further foreign body was notable today.  Antibiotic ointment was applied followed by dressing.  Discussed Epsom salts soaks as well as antibiotic ointment dressing changes daily.    Return if symptoms worsen or fail to improve.  Donnice JONELLE Fees DPM  Continue Epsom salt soaks. - Apply antibiotic ointment and bandage. - Monitor for infection signs: increased swelling, redness, drainage. - Order ultrasound if symptoms persist. -  Use cushioning pads to relieve pressure.  Right foot pain Pain linked to retained glass and wound manipulation, intermittent and worsens with pressure and activity. - Manage pain with Epsom salt soaks and cushioning pads. - Monitor pain levels and report increases.  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 Deformity may be congenital or from trauma. - Monitor symptoms and manage pain. - Consider further evaluation if symptoms worsen.      Return if symptoms worsen or fail to improve.   Donnice JONELLE Fees DPM
